# Patient Record
Sex: Female | Born: 1990 | Race: White | Hispanic: No | Marital: Single | Smoking: Light tobacco smoker
Health system: Southern US, Community
[De-identification: ages and names within clinical notes are randomized; demographics above are authoritative.]

## PROBLEM LIST (undated history)

## (undated) ENCOUNTER — Inpatient Hospital Stay (HOSPITAL_COMMUNITY): Payer: Self-pay

## (undated) DIAGNOSIS — N83209 Unspecified ovarian cyst, unspecified side: Secondary | ICD-10-CM

## (undated) DIAGNOSIS — D649 Anemia, unspecified: Secondary | ICD-10-CM

## (undated) HISTORY — PX: CHOLECYSTECTOMY: SHX55

---
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
## 2010-01-06 ENCOUNTER — Inpatient Hospital Stay (HOSPITAL_COMMUNITY): Admission: AD | Admit: 2010-01-06 | Discharge: 2010-01-06 | Payer: Self-pay | Admitting: Obstetrics & Gynecology

## 2010-01-07 ENCOUNTER — Inpatient Hospital Stay (HOSPITAL_COMMUNITY): Admission: AD | Admit: 2010-01-07 | Discharge: 2010-01-07 | Payer: Self-pay | Admitting: Obstetrics & Gynecology

## 2010-01-07 ENCOUNTER — Ambulatory Visit: Payer: Self-pay | Admitting: Obstetrics and Gynecology

## 2010-03-18 ENCOUNTER — Ambulatory Visit (HOSPITAL_COMMUNITY): Admission: RE | Admit: 2010-03-18 | Discharge: 2010-03-18 | Payer: Self-pay | Admitting: Obstetrics & Gynecology

## 2010-04-19 ENCOUNTER — Ambulatory Visit: Payer: Self-pay | Admitting: Family

## 2010-04-19 ENCOUNTER — Inpatient Hospital Stay (HOSPITAL_COMMUNITY): Admission: AD | Admit: 2010-04-19 | Discharge: 2010-04-20 | Payer: Self-pay | Admitting: Obstetrics and Gynecology

## 2010-05-26 ENCOUNTER — Inpatient Hospital Stay (HOSPITAL_COMMUNITY): Admission: AD | Admit: 2010-05-26 | Discharge: 2010-05-26 | Payer: Self-pay | Admitting: Obstetrics and Gynecology

## 2010-07-03 ENCOUNTER — Inpatient Hospital Stay (HOSPITAL_COMMUNITY): Admission: AD | Admit: 2010-07-03 | Discharge: 2010-07-03 | Payer: Self-pay | Admitting: Obstetrics

## 2010-07-20 ENCOUNTER — Inpatient Hospital Stay (HOSPITAL_COMMUNITY)
Admission: AD | Admit: 2010-07-20 | Discharge: 2010-07-20 | Payer: Self-pay | Source: Home / Self Care | Admitting: Obstetrics

## 2010-07-30 ENCOUNTER — Inpatient Hospital Stay (HOSPITAL_COMMUNITY): Admission: AD | Admit: 2010-07-30 | Discharge: 2010-07-30 | Payer: Self-pay | Admitting: Obstetrics

## 2010-08-01 ENCOUNTER — Inpatient Hospital Stay (HOSPITAL_COMMUNITY)
Admission: AD | Admit: 2010-08-01 | Discharge: 2010-08-01 | Payer: Self-pay | Source: Home / Self Care | Admitting: Obstetrics

## 2010-08-03 ENCOUNTER — Inpatient Hospital Stay (HOSPITAL_COMMUNITY)
Admission: AD | Admit: 2010-08-03 | Discharge: 2010-08-03 | Payer: Self-pay | Source: Home / Self Care | Admitting: Obstetrics & Gynecology

## 2010-08-10 ENCOUNTER — Inpatient Hospital Stay (HOSPITAL_COMMUNITY)
Admission: AD | Admit: 2010-08-10 | Discharge: 2010-08-13 | Payer: Self-pay | Source: Home / Self Care | Attending: Obstetrics | Admitting: Obstetrics

## 2010-09-20 ENCOUNTER — Encounter: Payer: Self-pay | Admitting: Pediatrics

## 2010-11-10 LAB — COMPREHENSIVE METABOLIC PANEL
ALT: 10 U/L (ref 0–35)
Chloride: 111 mEq/L (ref 96–112)
Creatinine, Ser: 0.58 mg/dL (ref 0.4–1.2)
GFR calc non Af Amer: >60 mL/min (ref >60)
Potassium: 3.7 mEq/L (ref 3.5–5.1)
Sodium: 138 mEq/L (ref 135–145)
Total Bilirubin: 0.4 mg/dL (ref 0.3–1.2)

## 2010-11-10 LAB — CBC
HCT: 29.5 % — ABNORMAL LOW (ref 36.0–46.0)
Hemoglobin: 8.9 g/dL — ABNORMAL LOW (ref 12.0–15.0)
Hemoglobin: 9.8 g/dL — ABNORMAL LOW (ref 12.0–15.0)
MCH: 29.3 pg (ref 26.0–34.0)
MCH: 29.5 pg (ref 26.0–34.0)
MCHC: 33.3 g/dL (ref 30.0–36.0)
MCHC: 33.6 g/dL (ref 30.0–36.0)
Platelets: 221 10*3/uL (ref 150–400)
RBC: 3.01 MIL/uL — ABNORMAL LOW (ref 3.87–5.11)
RBC: 3.6 MIL/uL — ABNORMAL LOW (ref 3.87–5.11)
RBC: 3.31 MIL/uL — ABNORMAL LOW (ref 3.87–5.11)
RDW: 15.7 % — ABNORMAL HIGH (ref 11.5–15.5)
RDW: 13.5 % (ref 11.5–15.5)
WBC: 10 10*3/uL (ref 4.0–10.5)
WBC: 11.7 10*3/uL — ABNORMAL HIGH (ref 4.0–10.5)
WBC: 9.8 10*3/uL (ref 4.0–10.5)

## 2010-11-10 LAB — URINALYSIS, ROUTINE W REFLEX MICROSCOPIC
Bilirubin Urine: NEGATIVE
Bilirubin Urine: NEGATIVE
Nitrite: NEGATIVE
Nitrite: NEGATIVE
Protein, ur: NEGATIVE mg/dL
Specific Gravity, Urine: 1.02 (ref 1.005–1.030)
Urobilinogen, UA: 0.2 mg/dL (ref 0.0–1.0)
pH: 5.5 (ref 5.0–8.0)
pH: 6 (ref 5.0–8.0)

## 2010-11-12 LAB — URINALYSIS, ROUTINE W REFLEX MICROSCOPIC
Bilirubin Urine: NEGATIVE
Hgb urine dipstick: NEGATIVE
Specific Gravity, Urine: 1.015 (ref 1.005–1.030)

## 2010-11-12 LAB — FETAL FIBRONECTIN: Fetal Fibronectin: NEGATIVE

## 2010-11-17 LAB — URINE MICROSCOPIC-ADD ON

## 2010-11-17 LAB — URINALYSIS, ROUTINE W REFLEX MICROSCOPIC
Glucose, UA: NEGATIVE mg/dL
Glucose, UA: NEGATIVE mg/dL
Ketones, ur: >80 mg/dL — AB
Protein, ur: 30 mg/dL — AB
Specific Gravity, Urine: 1.005 (ref 1.005–1.030)
pH: 5.5 (ref 5.0–8.0)

## 2010-11-17 LAB — DIFFERENTIAL
Eosinophils Relative: 0 % (ref 0–5)
Lymphocytes Relative: 12 % (ref 12–46)
Lymphs Abs: 2.2 10*3/uL (ref 0.7–4.0)
Neutro Abs: 15.1 10*3/uL — ABNORMAL HIGH (ref 1.7–7.7)

## 2010-11-17 LAB — BASIC METABOLIC PANEL
BUN: 8 mg/dL (ref 6–23)
Calcium: 8.7 mg/dL (ref 8.4–10.5)
GFR calc non Af Amer: >60 mL/min (ref >60)
Potassium: 3.5 mEq/L (ref 3.5–5.1)
Sodium: 130 mEq/L — ABNORMAL LOW (ref 135–145)

## 2010-11-17 LAB — CBC
HCT: 36.3 % (ref 36.0–46.0)
Hemoglobin: 12.6 g/dL (ref 12.0–15.0)
Hemoglobin: 13 g/dL (ref 12.0–15.0)
MCV: 92.9 fL (ref 78.0–100.0)
Platelets: 291 10*3/uL (ref 150–400)
Platelets: 291 10*3/uL (ref 150–400)
RBC: 4.07 MIL/uL (ref 3.87–5.11)
WBC: 18.6 10*3/uL — ABNORMAL HIGH (ref 4.0–10.5)

## 2011-01-12 NOTE — Discharge Summary (Signed)
NAMELAGENA, Karla Stafford                 ACCOUNT NO.:  [id number]   MEDICAL RECORD NO.:  [id number]          PATIENT TYPE:  INP   LOCATION:  6123                         FACILITY:  [facility]   PHYSICIAN:  Karla Hoover, MD    DATE OF BIRTH:  September 18, 1990   DATE OF ADMISSION:  04/24/2007  DATE OF DISCHARGE:  04/27/2007                               DISCHARGE SUMMARY   REASON FOR HOSPITALIZATION:  Nuchal rigidity, headache, and fever.   SIGNIFICANT FINDINGS ON ADMISSION:  Photophobia, resolved.  Emesis and  myalgias in left lower flank.  Temperature 99.2, heart rate 107.  Mild  nuchal rigidity limited to 45-degree flexion.  No Kernig or Brudzinski.  Alert and oriented.  Pustule on posterior thigh.  White blood count of  15.7, platelets 310,000.  Sodium 133, potassium 3.3.  Urinalysis within  normal limits.  Mono negative.  Rapid strep negative.  CSF with 2 white  blood cells (19% neutrophils, 86% lymphocytes), 1 red blood cell,  glucose 60, protein 22.  Gram stain showed white blood cells with no  organisms.  Negative influenza test.   TREATMENT:  Rocephin x2 at urgent care.  Started on doxycycline at  urgent care and continued here in the hospital.  IV fluids, Tylenol,  morphine, Toradol, oxycodone, and OxyContin.  Also, Valtrex per home  regimen and MiraLax.   OPERATIONS AND PROCEDURE:  Lumbar puncture in the emergency room,  fluoroscopy guided.   FINAL DIAGNOSIS:  Possible [place] Spotted Fever.   DISCHARGE MEDICATIONS AND INSTRUCTIONS:  1. Doxycycline 100 mg p.o. x7 days to complete a 10-day course.  2. Oxycodone 5 mg p.o. q.4 to 6 hours p.r.n. pain.  We have dispensed      18.  3. MiraLax 17 grams in water p.o. daily.  4. Valtrex as prescribed by primary care physician.  No prescription      given.   PENDING RESULTS AND ISSUES TO BE FOLLOWED:  Neck and shoulder myalgias  as well as an HIV DNA PCR.   FOLLOWUP:  Scheduled with Burnett Kanaris on May 02, 2007 at  10:40 a.m.   DISCHARGE WEIGHT:  80 kg.   DISCHARGE CONDITION:  Good.      Romero Belling, MD  Electronically Signed      Karla Hoover, MD  Electronically Signed    MO/MEDQ  D:  04/27/2007  T:  04/27/2007  Job:  [id number]   cc:   Burnett Kanaris [phone]

## 2011-01-12 NOTE — Op Note (Signed)
Karla Stafford, Karla Stafford                 ACCOUNT NO.:  [id number]   MEDICAL RECORD NO.:  [id number]          PATIENT TYPE:  AMB   LOCATION:  ENDO                         FACILITY:  [facility]   PHYSICIAN:  Anselmo Rod, M.D.  DATE OF BIRTH:  22-Jan-1991   DATE OF PROCEDURE:  10/20/2007  DATE OF DISCHARGE:                               OPERATIVE REPORT   PROCEDURE PERFORMED:  Esophagogastroduodenoscopy with antral biopsies.   ENDOSCOPIST:  Anselmo Rod, M.D.   INSTRUMENT USED:  Pentax video panendoscope.   INDICATIONS FOR PROCEDURE:  19 year old white female with a history of  abnormal weight gain of 40 pounds in the last year, severe nausea and  vomiting, biliary dyskinesia noted on recent HIDA scan where she had an  EF of 30%, undergoing EGD for nausea, vomiting and severe reflux.  Surgical evaluation is planned but peptic ulcer disease, etc., needs to  be ruled out prior to the evaluation. Therefore, EGD is being done.   PREPROCEDURE PREPARATION:  Informed consent was procured from the  patient.  The patient fasted for 8 hours prior to the procedure.  The  risks and benefits of the procedure were discussed with the patient in  detail.   PREPROCEDURE PHYSICAL:  The patient had stable vital signs.  Neck  supple.  Chest clear to auscultation.  S1 and S2 regular.  Abdomen soft  with normal bowel sounds.   DESCRIPTION OF PROCEDURE:  The patient was placed in the left lateral  decubitus position and sedated with 75 mcg of Fentanyl and 7 mg of  Versed given intravenously in slow incremental doses.  Once the patient  was adequately sedated and maintained on low flow oxygen and continuous  cardiac monitoring, the Pentax video panendoscope was advanced through  the mouthpiece over the tongue into the esophagus under direct vision.  The vocal cords appeared healthy.  The entire esophagus was widely  patent with no evidence of ring, stricture, mass, esophagitis or  Barrett's mucosa.  The  scope was then advanced in the stomach.  The GE  junction appeared healthy.  There was some debris along the greater  curvature.  This was very small in amount and the rest of the gastric  mucosa showed evidence of diffuse gastritis.  A small hiatal hernia was  seen on high retroflexion.  Antral biopsies were done to rule out  presence of H. pylori by pathology.  The proximal small bowel appeared  normal with no outlet obstruction.  The patient tolerated the procedure  well without immediate complications.  No ulcers, erosions, masses or  polyps were seen throughout the upper GI tract.   IMPRESSION:  1. Normal appearing vocal cords.  2. Healthy appearing esophagus.  3. Normal appearing GE junction.  4. Debris in the stomach along the right greater curvature.  5. Diffuse gastritis.  Antral biopsies done to rule out H. pylori.  6. Normal proximal small bowel.   RECOMMENDATIONS:  1. A gastric emptying study will be done to rule out gastroparesis      prior to surgical evaluation.  2. The patient is  to avoid all nonsteroidals for now.  3. Continue Prevacid for reflux.  4  Treat with antibiotics if H. pylori present on biopsies.  1. Outpatient follow up in the next two weeks, earlier if need be.      Anselmo Rod, M.D.  Electronically Signed     JNM/MEDQ  D:  10/20/2007  T:  10/21/2007  Job:  [id number]   cc:   Candyce Churn. Allyne Gee, M.D.  Fax: [phone]

## 2011-05-25 ENCOUNTER — Emergency Department (HOSPITAL_COMMUNITY)
Admission: EM | Admit: 2011-05-25 | Discharge: 2011-05-26 | Disposition: A | Discharge status: Home / Self Care | Attending: Emergency Medicine | Admitting: Emergency Medicine

## 2011-05-25 DIAGNOSIS — R509 Fever, unspecified: Secondary | ICD-10-CM | POA: Insufficient documentation

## 2011-05-25 DIAGNOSIS — R110 Nausea: Secondary | ICD-10-CM | POA: Insufficient documentation

## 2011-05-25 DIAGNOSIS — R51 Headache: Secondary | ICD-10-CM | POA: Insufficient documentation

## 2011-05-25 DIAGNOSIS — M2569 Stiffness of other specified joint, not elsewhere classified: Secondary | ICD-10-CM | POA: Insufficient documentation

## 2011-05-25 DIAGNOSIS — H5710 Ocular pain, unspecified eye: Secondary | ICD-10-CM | POA: Insufficient documentation

## 2011-05-25 DIAGNOSIS — H53149 Visual discomfort, unspecified: Secondary | ICD-10-CM | POA: Insufficient documentation

## 2011-05-25 DIAGNOSIS — M549 Dorsalgia, unspecified: Secondary | ICD-10-CM | POA: Insufficient documentation

## 2011-05-25 DIAGNOSIS — A779 Spotted fever, unspecified: Secondary | ICD-10-CM | POA: Insufficient documentation

## 2011-05-25 DIAGNOSIS — IMO0001 Reserved for inherently not codable concepts without codable children: Secondary | ICD-10-CM | POA: Insufficient documentation

## 2011-05-25 DIAGNOSIS — R55 Syncope and collapse: Secondary | ICD-10-CM | POA: Insufficient documentation

## 2011-05-25 DIAGNOSIS — M542 Cervicalgia: Secondary | ICD-10-CM | POA: Insufficient documentation

## 2011-05-25 LAB — POCT I-STAT, CHEM 8
BUN: 9 mg/dL (ref 6–23)
Chloride: 106 mEq/L (ref 96–112)
Creatinine, Ser: 1 mg/dL (ref 0.50–1.10)
Sodium: 142 mEq/L (ref 135–145)
TCO2: 23 mmol/L (ref 0–100)

## 2011-05-25 LAB — CBC
HCT: 42 % (ref 36.0–46.0)
Hemoglobin: 14.3 g/dL (ref 12.0–15.0)
MCH: 29.1 pg (ref 26.0–34.0)
MCHC: 34 g/dL (ref 30.0–36.0)
MCV: 85.5 fL (ref 78.0–100.0)
RBC: 4.91 MIL/uL (ref 3.87–5.11)

## 2011-05-25 LAB — DIFFERENTIAL
Basophils Relative: 0 % (ref 0–1)
Lymphocytes Relative: 40 % (ref 12–46)
Lymphs Abs: 3 10*3/uL (ref 0.7–4.0)
Monocytes Absolute: 0.6 10*3/uL (ref 0.1–1.0)
Monocytes Relative: 8 % (ref 3–12)
Neutro Abs: 3.7 10*3/uL (ref 1.7–7.7)

## 2011-06-10 LAB — DIFFERENTIAL
Basophils Absolute: 0
Basophils Relative: 0
Eosinophils Absolute: 0
Eosinophils Relative: 0
Lymphocytes Relative: 11 — ABNORMAL LOW
Lymphs Abs: 2.3
Monocytes Absolute: 1.4 — ABNORMAL HIGH
Monocytes Relative: 7
Neutro Abs: 16.3 — ABNORMAL HIGH
Neutrophils Relative %: 82 — ABNORMAL HIGH

## 2011-06-10 LAB — CBC
HCT: 31.5 — ABNORMAL LOW
Hemoglobin: 10.8 — ABNORMAL LOW
MCHC: 34.2
MCV: 86.6
Platelets: 362 — ABNORMAL HIGH
RBC: 3.64 — ABNORMAL LOW
RDW: 13.2
WBC: 20 — ABNORMAL HIGH

## 2011-06-10 LAB — BASIC METABOLIC PANEL
BUN: 12
CO2: 21
Chloride: 106
Creatinine, Ser: 0.87
Glucose, Bld: 124 — ABNORMAL HIGH
Potassium: 3.4 — ABNORMAL LOW

## 2011-06-10 LAB — BASIC METABOLIC PANEL WITH GFR
Calcium: 8.3 — ABNORMAL LOW
Sodium: 136

## 2011-06-11 LAB — COMPREHENSIVE METABOLIC PANEL
ALT: 13
AST: 11
Albumin: 2.7 — ABNORMAL LOW
Alkaline Phosphatase: 75
BUN: 9
CO2: 26
Calcium: 8.8
Chloride: 110
Creatinine, Ser: 0.75
Glucose, Bld: 95
Potassium: 3.5
Sodium: 143
Total Bilirubin: 0.4
Total Protein: 5.8 — ABNORMAL LOW

## 2011-06-11 LAB — PREGNANCY, URINE: Preg Test, Ur: NEGATIVE

## 2011-06-11 LAB — PROTEIN, CSF: Total  Protein, CSF: 22

## 2011-06-11 LAB — CBC
HCT: 33.3 — ABNORMAL LOW
HCT: 37.3
Hemoglobin: 11.4 — ABNORMAL LOW
Hemoglobin: 12.8
MCHC: 34.1
MCHC: 34.4
MCV: 87.9
MCV: 88.7
Platelets: 310
Platelets: 317
RBC: 3.76 — ABNORMAL LOW
RDW: 12.8
RDW: 13.4
WBC: 10.4 — ABNORMAL HIGH

## 2011-06-11 LAB — CSF CELL COUNT WITH DIFFERENTIAL
Eosinophils, CSF: 0
Eosinophils, CSF: 0
Other Cells, CSF: 0
Other Cells, CSF: 1
Segmented Neutrophils-CSF: 1
Segmented Neutrophils-CSF: 2
Tube #: 1
Tube #: 4

## 2011-06-11 LAB — DIFFERENTIAL
Basophils Absolute: 0
Basophils Relative: 0
Eosinophils Absolute: 0
Eosinophils Relative: 0
Monocytes Absolute: 1.7 — ABNORMAL HIGH

## 2011-06-11 LAB — GRAM STAIN

## 2011-06-11 LAB — URINALYSIS, ROUTINE W REFLEX MICROSCOPIC
Glucose, UA: NEGATIVE
Hgb urine dipstick: NEGATIVE
Ketones, ur: NEGATIVE
Protein, ur: NEGATIVE
Urobilinogen, UA: 0.2

## 2011-06-11 LAB — ENTEROVIRUS PCR: Enterovirus PCR: NEGATIVE

## 2011-06-11 LAB — BASIC METABOLIC PANEL
BUN: 11
CO2: 22
Glucose, Bld: 129 — ABNORMAL HIGH
Potassium: 3.3 — ABNORMAL LOW
Sodium: 133 — ABNORMAL LOW

## 2011-06-11 LAB — CSF CULTURE W GRAM STAIN

## 2011-06-11 LAB — INFLUENZA A+B VIRUS AG-DIRECT(RAPID)

## 2011-06-11 LAB — MONONUCLEOSIS SCREEN: Mono Screen: NEGATIVE

## 2011-06-11 LAB — CULTURE, BLOOD (ROUTINE X 2)
Culture: NO GROWTH
Culture: NO GROWTH

## 2011-06-11 LAB — HIV-1 RNA, QUALITATIVE, TMA: HIV-1 RNA, Qualitative, TMA: NOT DETECTED

## 2011-06-11 LAB — GLUCOSE, CSF: Glucose, CSF: 60

## 2011-06-11 LAB — RPR: RPR Ser Ql: NONREACTIVE

## 2011-07-01 ENCOUNTER — Inpatient Hospital Stay (INDEPENDENT_AMBULATORY_CARE_PROVIDER_SITE_OTHER)
Admission: RE | Admit: 2011-07-01 | Discharge: 2011-07-01 | Disposition: A | Discharge status: Home / Self Care | Payer: Self-pay | Source: Ambulatory Visit | Attending: Family Medicine | Admitting: Family Medicine

## 2011-07-01 DIAGNOSIS — J40 Bronchitis, not specified as acute or chronic: Secondary | ICD-10-CM

## 2012-03-22 ENCOUNTER — Encounter (HOSPITAL_COMMUNITY): Payer: Self-pay

## 2012-03-22 ENCOUNTER — Encounter (HOSPITAL_COMMUNITY): Payer: Self-pay | Admitting: *Deleted

## 2012-03-22 ENCOUNTER — Emergency Department (HOSPITAL_COMMUNITY)
Admission: EM | Admit: 2012-03-22 | Discharge: 2012-03-22 | Disposition: A | Discharge status: Home / Self Care | Attending: Emergency Medicine | Admitting: Emergency Medicine

## 2012-03-22 ENCOUNTER — Emergency Department (INDEPENDENT_AMBULATORY_CARE_PROVIDER_SITE_OTHER)
Admission: EM | Admit: 2012-03-22 | Discharge: 2012-03-22 | Disposition: A | Discharge status: Nursing Home (Includes ICF) | Source: Home / Self Care | Attending: Emergency Medicine | Admitting: Emergency Medicine

## 2012-03-22 ENCOUNTER — Emergency Department (HOSPITAL_COMMUNITY)

## 2012-03-22 DIAGNOSIS — N760 Acute vaginitis: Secondary | ICD-10-CM

## 2012-03-22 DIAGNOSIS — R5383 Other fatigue: Secondary | ICD-10-CM | POA: Insufficient documentation

## 2012-03-22 DIAGNOSIS — A499 Bacterial infection, unspecified: Secondary | ICD-10-CM | POA: Insufficient documentation

## 2012-03-22 DIAGNOSIS — R102 Pelvic and perineal pain: Secondary | ICD-10-CM

## 2012-03-22 DIAGNOSIS — N949 Unspecified condition associated with female genital organs and menstrual cycle: Secondary | ICD-10-CM

## 2012-03-22 DIAGNOSIS — R109 Unspecified abdominal pain: Secondary | ICD-10-CM | POA: Insufficient documentation

## 2012-03-22 DIAGNOSIS — B9689 Other specified bacterial agents as the cause of diseases classified elsewhere: Secondary | ICD-10-CM | POA: Insufficient documentation

## 2012-03-22 DIAGNOSIS — R10819 Abdominal tenderness, unspecified site: Secondary | ICD-10-CM | POA: Insufficient documentation

## 2012-03-22 DIAGNOSIS — R5381 Other malaise: Secondary | ICD-10-CM | POA: Insufficient documentation

## 2012-03-22 LAB — POCT I-STAT, CHEM 8
Calcium, Ion: 1.28 mmol/L — ABNORMAL HIGH (ref 1.12–1.23)
Creatinine, Ser: 0.8 mg/dL (ref 0.50–1.10)
Glucose, Bld: 83 mg/dL (ref 70–99)
HCT: 45 % (ref 36.0–46.0)
Hemoglobin: 15.3 g/dL — ABNORMAL HIGH (ref 12.0–15.0)
Potassium: 4.2 mEq/L (ref 3.5–5.1)

## 2012-03-22 LAB — URINALYSIS, ROUTINE W REFLEX MICROSCOPIC
Bilirubin Urine: NEGATIVE
Hgb urine dipstick: NEGATIVE
Ketones, ur: NEGATIVE mg/dL
Nitrite: NEGATIVE
Specific Gravity, Urine: 1.024 (ref 1.005–1.030)
Urobilinogen, UA: 0.2 mg/dL (ref 0.0–1.0)
pH: 5 (ref 5.0–8.0)

## 2012-03-22 LAB — WET PREP, GENITAL
Trich, Wet Prep: NONE SEEN
Yeast Wet Prep HPF POC: NONE SEEN

## 2012-03-22 LAB — LIPASE, BLOOD: Lipase: 21 U/L (ref 11–59)

## 2012-03-22 LAB — CBC
HCT: 43.5 % (ref 36.0–46.0)
Hemoglobin: 14.5 g/dL (ref 12.0–15.0)
MCHC: 33.3 g/dL (ref 30.0–36.0)
MCV: 88.6 fL (ref 78.0–100.0)
RDW: 13.1 % (ref 11.5–15.5)

## 2012-03-22 LAB — POCT PREGNANCY, URINE: Preg Test, Ur: NEGATIVE

## 2012-03-22 LAB — POCT URINALYSIS DIP (DEVICE)
Bilirubin Urine: NEGATIVE
Glucose, UA: NEGATIVE mg/dL

## 2012-03-22 MED ORDER — OXYCODONE-ACETAMINOPHEN 5-325 MG PO TABS: 2.0000 | ORAL_TABLET | ORAL | Status: AC | PRN

## 2012-03-22 MED ORDER — MORPHINE SULFATE 4 MG/ML IJ SOLN
4.0000 mg | Freq: Once | INTRAMUSCULAR | Status: AC
  Administered 2012-03-22: 4 mg via INTRAVENOUS
  Filled 2012-03-22: qty 1

## 2012-03-22 MED ORDER — HYDROCODONE-ACETAMINOPHEN 5-325 MG PO TABS
2.0000 | ORAL_TABLET | Freq: Once | ORAL | Status: AC
  Administered 2012-03-22: 2 via ORAL
  Filled 2012-03-22: qty 2

## 2012-03-22 MED ORDER — METRONIDAZOLE 500 MG PO TABS
500.0000 mg | ORAL_TABLET | Freq: Once | ORAL | Status: AC
Start: 2012-03-22 — End: 2012-03-22
  Administered 2012-03-22: 500 mg via ORAL
  Filled 2012-03-22: qty 2

## 2012-03-22 MED ORDER — METRONIDAZOLE 500 MG PO TABS: 500.0000 mg | ORAL_TABLET | Freq: Two times a day (BID) | ORAL | Status: AC

## 2012-03-22 NOTE — ED Notes (Signed)
To ED from [facility] for further eval of right and left lower quad pain. States she had her son 19 months ago and has just had her first menses since giving birth. States her menses was 'long'. Denies urinary discomfort. Denies vaginal discharge.

## 2012-03-22 NOTE — ED Provider Notes (Addendum)
History     CSN: [id number]  Arrival date & time 03/22/12  1627   First MD Initiated Contact with Patient 03/22/12 1642      Chief Complaint  Patient presents with  . Abdominal Pain    (Consider location/radiation/quality/duration/timing/severity/associated sxs/prior treatment) HPI Comments: Patient presents to urgent care complaining that she has been having her period since July 13 interval yesterday. For the last 2 days have been having moderate to severe lower abdominal pain (patient points to pelvic area bilaterally). She denies any urinary symptoms such as pressure, burning on urination or fevers. Also denies  vomiting. She describes that the pain is constant and movement does exacerbate her pain. He has felt some nausea but has not vomited. She relates that since she gave birth she has not had a normal menstrual cycle her. Since then. This last period lasted several days more than usual and had more blood than what she has seen.  Patient is a 21 y.o. female presenting with abdominal pain. The history is provided by the patient.  Abdominal Pain The primary symptoms of the illness include abdominal pain, nausea and vaginal bleeding. The primary symptoms of the illness do not include fever, fatigue, vomiting, diarrhea, hematochezia, dysuria or vaginal discharge. The current episode started more than 2 days ago. The onset of the illness was gradual. The problem has been gradually worsening.  The patient states that she believes she is currently not pregnant. The patient has not had a change in bowel habit. Symptoms associated with the illness do not include chills, anorexia, constipation or back pain. Significant associated medical issues do not include GERD.    History reviewed. No pertinent past medical history.  Past Surgical History  Procedure Date  . Cholecystectomy     No family history on file.  History  Substance Use Topics  . Smoking status: Current Everyday Smoker  .  Smokeless tobacco: Not on file  . Alcohol Use: No    OB History    Grav Para Term Preterm Abortions TAB SAB Ect Mult Living                  Review of Systems  Constitutional: Positive for activity change and appetite change. Negative for fever, chills and fatigue.  Gastrointestinal: Positive for nausea and abdominal pain. Negative for vomiting, diarrhea, constipation, hematochezia, rectal pain and anorexia.  Genitourinary: Positive for vaginal bleeding. Negative for dysuria and vaginal discharge.  Musculoskeletal: Negative for back pain.  Neurological: Negative for dizziness.    Allergies  Clindamycin/lincomycin; Doxycycline; and Penicillins  Home Medications   Current Outpatient Rx  Name Route Sig Dispense Refill  . METRONIDAZOLE 500 MG PO TABS Oral Take 1 tablet (500 mg total) by mouth 2 (two) times daily. 14 tablet 0  . OXYCODONE-ACETAMINOPHEN 5-325 MG PO TABS Oral Take 2 tablets by mouth every 4 (four) hours as needed for pain. 15 tablet 0    BP 105/71  Pulse 72  Temp 97.5 F (36.4 C) (Oral)  Resp 18  SpO2 98%  LMP 03/07/2012  Physical Exam  Nursing note and vitals reviewed. Constitutional: She appears distressed.  Eyes: Conjunctivae are normal.  Neck: Neck supple.  Pulmonary/Chest: Effort normal.  Abdominal: She exhibits no distension. There is no hepatosplenomegaly. There is tenderness in the right lower quadrant, suprapubic area and left lower quadrant. There is no rigidity, no rebound, no guarding and no CVA tenderness.    Neurological: She is alert.  Skin: No erythema.    ED  Course  Procedures (including critical care time)   Labs Reviewed  POCT URINALYSIS DIP (DEVICE)  POCT PREGNANCY, URINE   Ct Abdomen Pelvis Wo Contrast  03/22/2012  *RADIOLOGY REPORT*  Clinical Data: Flank pain  CT ABDOMEN AND PELVIS WITHOUT CONTRAST  Technique:  Multidetector CT imaging of the abdomen and pelvis was performed following the standard protocol without intravenous  contrast.  Comparison: None.  Findings: Lung bases are clear.  Liver, spleen, pancreas, and adrenal glands are within normal limits.  Status post cholecystectomy.  No intrahepatic or extrahepatic ductal dilatation.  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  Kidneys are otherwise unremarkable.  No renal calculi or hydronephrosis.  No evidence of bowel obstruction.  Normal appendix.  No evidence of abdominal aortic aneurysm.  No abdominopelvic ascites.  Small upper abdominal/retroperitoneal lymph nodes which do not meet pathologic CT size criteria.  Uterus and bilateral ovaries are unremarkable.  No ureteral or bladder calculi.  Bladder is decompressed.  Visualized osseous structures are within normal limits.  IMPRESSION: No renal, ureteral, or bladder calculi.  No hydronephrosis.  Normal appendix.  No evidence of bowel obstruction.  No CT findings to account for the patient's flank pain.  Original Report Authenticated By: Charline Bills, M.D.     1. Pelvic pain in female       MDM  Moderate to severe reproducible lower abdominal pain bilaterally. Suspect given patient's recent menstrual irregularities that this might be an ovarian cyst or hemorrhagic ovarian cyst ruptured. Her exam was somewhat disproportionate as well to the degree of palpation but patient is crying describing severe pain with minimal palpation.        Jimmie Molly, MD 03/22/12 1809  Jimmie Molly, MD 03/23/12 1046

## 2012-03-22 NOTE — ED Notes (Signed)
Pt c/o increased pain to RLQ after returning from CT, pt tearful, orders received.

## 2012-03-22 NOTE — Progress Notes (Signed)
Medical screening examination/treatment/procedure(s) were conducted as a shared visit with non-physician practitioner(s) and myself.  I personally evaluated the patient during the encounter Pt is a 21 year old woman with RLQ pain.  Lab workup shows normal CBC, chemistries, urinalysis, CT of abdomen/pelvis.  Wet prep showed clue cells. Rx for Bacterial Vaginosis with metronidazole, pain medication.  Given cautions to return for vomiting, fever, increased abdominal pain.

## 2012-03-22 NOTE — ED Notes (Signed)
Pt c/o L lower abdominal pain onset 3 days ago.  Pt states she has felt nauseated, denies emesis.  Pt states pain radiates to lower back.  Pt denies urinary SX.  Pt states she gave birth in 12/12 and just had 1st menstrual cycle that was "different from how they used to be".

## 2012-03-22 NOTE — ED Provider Notes (Signed)
History     CSN: [id number]  Arrival date & time 03/22/12  9147   First MD Initiated Contact with Patient 03/22/12 2146      Chief Complaint  Patient presents with  . Abdominal Pain    (Consider location/radiation/quality/duration/timing/severity/associated sxs/prior treatment) HPI Comments: Patient is a 21 year old female who presents to the ED from urgent care. She has been experiencing lower abdominal pain for 3 days. The pain radiates from her lower abdomen into her upper thighs. She says the pain had gradually progressed over the past couple days. It started as a cramping sensation and now feels like a stabbing pain. She rated the pain 8/10 currently with associated nausea but no vomiting. Her last period started 03/06/2012 and have lasted until today. This is her first period since having her son in December 2011 and this period is different from her menses before giving birth as this period was heavier, lasted longer, and had many clots. Patient has tried ibuprofen for the pain which did not help.   Patient is a 20 y.o. female presenting with abdominal pain.  Abdominal Pain The primary symptoms of the illness include abdominal pain, fatigue and nausea. The primary symptoms of the illness do not include fever, shortness of breath, vomiting, diarrhea or dysuria.  Symptoms associated with the illness do not include diaphoresis, frequency or back pain.    History reviewed. No pertinent past medical history.  Past Surgical History  Procedure Date  . Cholecystectomy     History reviewed. No pertinent family history.  History  Substance Use Topics  . Smoking status: Current Everyday Smoker  . Smokeless tobacco: Not on file  . Alcohol Use: No    OB History    Grav Para Term Preterm Abortions TAB SAB Ect Mult Living                  Review of Systems  Constitutional: Positive for appetite change and fatigue. Negative for fever and diaphoresis.  Respiratory: Negative for  cough, shortness of breath and wheezing.   Cardiovascular: Negative for chest pain.  Gastrointestinal: Positive for nausea and abdominal pain. Negative for vomiting and diarrhea.  Genitourinary: Negative for dysuria, frequency and difficulty urinating.  Musculoskeletal: Negative for back pain.  Neurological: Negative for headaches.    Allergies  Clindamycin/lincomycin; Doxycycline; and Penicillins  Home Medications  No current outpatient prescriptions on file.  BP 91/66  Pulse 89  Temp 98.7 F (37.1 C) (Oral)  Resp 18  SpO2 98%  LMP 03/07/2012  Physical Exam  Nursing note and vitals reviewed. Constitutional: She appears well-developed and well-nourished.  HENT:  Head: Normocephalic and atraumatic.  Mouth/Throat: Oropharynx is clear and moist. No oropharyngeal exudate.  Eyes: Conjunctivae are normal. No scleral icterus.  Neck: Normal range of motion.  Cardiovascular: Normal rate and regular rhythm.  Exam reveals no gallop and no friction rub.   No murmur heard. Pulmonary/Chest: Effort normal and breath sounds normal. She has no wheezes. She has no rales. She exhibits no tenderness.  Abdominal: Soft. There is tenderness. There is rebound. There is no guarding.       Patient expressed pain and tenderness at McBurney's point, especially with palpation. Generalized lower abdominal pain to palpation.   Genitourinary: No vaginal discharge found.       Vaginal mucosa pink and moist with moderate amount of white milky discharge. Patient expressed discomfort on speculum exam. Patient expressed generalized tenderness and pain to palpation of lower abdomen which was most  notable with palpation of RLQ. No masses palpated.   Musculoskeletal: Normal range of motion.  Neurological: She is alert.  Skin: Skin is warm and dry. She is not diaphoretic.  Psychiatric: She has a normal mood and affect. Her behavior is normal.    ED Course  Procedures (including critical care time)  Labs  Reviewed  CBC - Abnormal; Notable for the following:    Platelets 415 (*)     All other components within normal limits  POCT I-STAT, CHEM 8 - Abnormal; Notable for the following:    Calcium, Ion 1.28 (*)     Hemoglobin 15.3 (*)     All other components within normal limits  WET PREP, GENITAL - Abnormal; Notable for the following:    Clue Cells Wet Prep HPF POC MODERATE (*)     WBC, Wet Prep HPF POC FEW (*)     All other components within normal limits  URINALYSIS, ROUTINE W REFLEX MICROSCOPIC  POCT PREGNANCY, URINE  LIPASE, BLOOD  GC/CHLAMYDIA PROBE AMP, GENITAL   Ct Abdomen Pelvis Wo Contrast  03/22/2012  *RADIOLOGY REPORT*  Clinical Data: Flank pain  CT ABDOMEN AND PELVIS WITHOUT CONTRAST  Technique:  Multidetector CT imaging of the abdomen and pelvis was performed following the standard protocol without intravenous contrast.  Comparison: None.  Findings: Lung bases are clear.  Liver, spleen, pancreas, and adrenal glands are within normal limits.  Status post cholecystectomy.  No intrahepatic or extrahepatic ductal dilatation.  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  Kidneys are otherwise unremarkable.  No renal calculi or hydronephrosis.  No evidence of bowel obstruction.  Normal appendix.  No evidence of abdominal aortic aneurysm.  No abdominopelvic ascites.  Small upper abdominal/retroperitoneal lymph nodes which do not meet pathologic CT size criteria.  Uterus and bilateral ovaries are unremarkable.  No ureteral or bladder calculi.  Bladder is decompressed.  Visualized osseous structures are within normal limits.  IMPRESSION: No renal, ureteral, or bladder calculi.  No hydronephrosis.  Normal appendix.  No evidence of bowel obstruction.  No CT findings to account for the patient's flank pain.  Original Report Authenticated By: Charline Bills, M.D.     No diagnosis found.    MDM  10:07 PM Pelvic exam performed on patient which showed moderate  white discharge in vagina. Bimanual exam revealed generalized lower abdominal pain which was most prominent in the RLQ. CT of abdomen ordered.   11:04 PM Wet prep shows clue cells. Patient will be treated for BV. CT abdomen pending. Patient expresses pain in RLQ and will receive morphine.   11:27 PM Abdomen CT shows no evidence of bowel obstruction or appendicitis.   Patient shows no evidence of acute abdominal process based on her ED evaluation. Patient will be prescribed Metronidazole for BV. She is still experiencing pain of unknown etiology but there is no evidence of emergent process causing pain. She will be discharged with antibiotics and Percocet for pain. She is advised to return to the ED if symptoms worsen.     Emilia Beck, PA-C 03/23/12 [phone]

## 2012-03-23 LAB — GC/CHLAMYDIA PROBE AMP, GENITAL: GC Probe Amp, Genital: NEGATIVE

## 2012-03-23 NOTE — ED Provider Notes (Signed)
Medical screening examination/treatment/procedure(s) were conducted as a shared visit with non-physician practitioner(s) and myself.  I personally evaluated the patient during the encounter Medical screening examination/treatment/procedure(s) were conducted as a shared visit with non-physician practitioner(s) and myself. I personally evaluated the patient during the encounter  Pt is a 21 year old woman with RLQ pain. Lab workup shows normal CBC, chemistries, urinalysis, CT of abdomen/pelvis. Wet prep showed clue cells. Rx for Bacterial Vaginosis with metronidazole, pain medication. Given cautions to return for vomiting, fever, increased abdominal pain.       Carleene Cooper III, MD 03/23/12 [phone]

## 2013-03-25 ENCOUNTER — Encounter (HOSPITAL_COMMUNITY): Payer: Self-pay | Admitting: Nurse Practitioner

## 2013-03-25 ENCOUNTER — Emergency Department (HOSPITAL_COMMUNITY)
Admission: EM | Admit: 2013-03-25 | Discharge: 2013-03-25 | Disposition: A | Discharge status: Home / Self Care | Attending: Emergency Medicine | Admitting: Emergency Medicine

## 2013-03-25 ENCOUNTER — Emergency Department (HOSPITAL_COMMUNITY)

## 2013-03-25 DIAGNOSIS — R109 Unspecified abdominal pain: Secondary | ICD-10-CM | POA: Insufficient documentation

## 2013-03-25 DIAGNOSIS — Z3202 Encounter for pregnancy test, result negative: Secondary | ICD-10-CM | POA: Insufficient documentation

## 2013-03-25 DIAGNOSIS — N949 Unspecified condition associated with female genital organs and menstrual cycle: Secondary | ICD-10-CM | POA: Insufficient documentation

## 2013-03-25 DIAGNOSIS — R102 Pelvic and perineal pain: Secondary | ICD-10-CM

## 2013-03-25 DIAGNOSIS — Z87448 Personal history of other diseases of urinary system: Secondary | ICD-10-CM | POA: Insufficient documentation

## 2013-03-25 DIAGNOSIS — Z87440 Personal history of urinary (tract) infections: Secondary | ICD-10-CM | POA: Insufficient documentation

## 2013-03-25 DIAGNOSIS — Z880 Allergy status to penicillin: Secondary | ICD-10-CM | POA: Insufficient documentation

## 2013-03-25 DIAGNOSIS — F17200 Nicotine dependence, unspecified, uncomplicated: Secondary | ICD-10-CM | POA: Insufficient documentation

## 2013-03-25 DIAGNOSIS — Z79899 Other long term (current) drug therapy: Secondary | ICD-10-CM | POA: Insufficient documentation

## 2013-03-25 DIAGNOSIS — N938 Other specified abnormal uterine and vaginal bleeding: Secondary | ICD-10-CM | POA: Insufficient documentation

## 2013-03-25 LAB — CBC
HCT: 40.2 % (ref 36.0–46.0)
Hemoglobin: 13.5 g/dL (ref 12.0–15.0)
RBC: 4.6 MIL/uL (ref 3.87–5.11)
RDW: 13.2 % (ref 11.5–15.5)
WBC: 9.7 10*3/uL (ref 4.0–10.5)

## 2013-03-25 LAB — BASIC METABOLIC PANEL
BUN: 13 mg/dL (ref 6–23)
Chloride: 105 mEq/L (ref 96–112)
GFR calc Af Amer: >90 mL/min (ref >90)
Glucose, Bld: 104 mg/dL — ABNORMAL HIGH (ref 70–99)
Potassium: 3.7 mEq/L (ref 3.5–5.1)

## 2013-03-25 LAB — URINALYSIS, ROUTINE W REFLEX MICROSCOPIC
Bilirubin Urine: NEGATIVE
Nitrite: NEGATIVE
Specific Gravity, Urine: 1.033 — ABNORMAL HIGH (ref 1.005–1.030)
pH: 5 (ref 5.0–8.0)

## 2013-03-25 LAB — PROTIME-INR
INR: 1.12 (ref 0.00–1.49)
Prothrombin Time: 14.2 seconds (ref 11.6–15.2)

## 2013-03-25 MED ORDER — ONDANSETRON HCL 4 MG/2ML IJ SOLN
4.0000 mg | Freq: Once | INTRAMUSCULAR | Status: AC
  Administered 2013-03-25: 4 mg via INTRAVENOUS
  Filled 2013-03-25: qty 2

## 2013-03-25 MED ORDER — SODIUM CHLORIDE 0.9 % IV BOLUS (SEPSIS)
1000.0000 mL | Freq: Once | INTRAVENOUS | Status: AC
  Administered 2013-03-25: 1000 mL via INTRAVENOUS

## 2013-03-25 MED ORDER — ONDANSETRON 8 MG PO TBDP: ORAL_TABLET | ORAL | Status: DC

## 2013-03-25 MED ORDER — ESTROGENS CONJUGATED 1.25 MG PO TABS: 2.5000 mg | ORAL_TABLET | Freq: Two times a day (BID) | ORAL | Status: DC

## 2013-03-25 MED ORDER — MORPHINE SULFATE 4 MG/ML IJ SOLN
4.0000 mg | Freq: Once | INTRAMUSCULAR | Status: AC
  Administered 2013-03-25: 4 mg via INTRAVENOUS
  Filled 2013-03-25: qty 1

## 2013-03-25 MED ORDER — HYDROCODONE-ACETAMINOPHEN 5-325 MG PO TABS
1.0000 | ORAL_TABLET | ORAL | Status: DC | PRN
Start: 2013-03-25 — End: 2014-12-01

## 2013-03-25 NOTE — ED Notes (Signed)
US pelvis not completed.

## 2013-03-25 NOTE — ED Notes (Signed)
Patient transported to Ultrasound 

## 2013-03-25 NOTE — ED Notes (Addendum)
Pt using Nuva Ring and has been regular with periods prior to now.  Pt inserted this last Nuva Ring two weeks ago today and it worked well for one week; then pt began having very large clots along with bright red blood the size of her thumb per pt.  She is having some lower abdominal pain on the right.  Pt has had two pregnancies.

## 2013-03-25 NOTE — ED Provider Notes (Signed)
CSN: [id number]     Arrival date & time 03/25/13  1553 History     First MD Initiated Contact with Patient 03/25/13 1605     Chief Complaint  Patient presents with  . Vaginal Bleeding   (Consider location/radiation/quality/duration/timing/severity/associated sxs/prior Treatment) Patient is a 22 y.o. female presenting with vaginal bleeding. The history is provided by the patient and medical records. No language interpreter was used.  Vaginal Bleeding Associated symptoms: abdominal pain   Associated symptoms: no back pain, no dysuria, no fatigue, no fever, no nausea and no vaginal discharge     Karla Stafford is a 22 y.o. female  G1P1 with a hx of induced vaginal delivery presents to the Emergency Department complaining of gradual, persistent, progressively worsening vaginal bleeding in the right lower pelvic pain beginning one week ago.  Patient states history of Nuvaring use for contraception.  Patient states she removed the NuvaRing on 03/04/2013, blood for 4 days as usual and reinserted the rain on 03/11/2013.   Patient one week without symptoms and then began with vaginal bleeding on 03/18/2013.  She removed the NuvaRing 3 days later with persistent vaginal bleeding. She denies sexual trauma. Pt is sexually active with one sexual partner no history of STDs. Associated symptoms include right lower pelvic pain beginning at the same time if the vaginal bleeding as well as a large number of clots.  Nothing makes it better and nothing makes it worse.  Pt denies fever, chills, headache, neck pain, chest pain, shortness of breath, nausea, vomiting, diarrhea, weakness, dizziness, syncope, dysuria, vaginal discharge, dyspareunia.  Patient reports recent history of urinary tract infection with pyelonephritis and sepsis for which she was hospitalized and treated. She states 3 weeks ago her urine was clean reevaluation. Patient does not have a current OB/GYN.   History reviewed. No pertinent past medical  history. Past Surgical History  Procedure Laterality Date  . Cholecystectomy     History reviewed. No pertinent family history. History  Substance Use Topics  . Smoking status: Current Every Day Smoker  . Smokeless tobacco: Not on file  . Alcohol Use: No   OB History   Grav Para Term Preterm Abortions TAB SAB Ect Mult Living            1     Review of Systems  Constitutional: Negative for fever, diaphoresis, appetite change, fatigue and unexpected weight change.  HENT: Negative for mouth sores, trouble swallowing, neck pain and neck stiffness.   Respiratory: Negative for cough, chest tightness, shortness of breath, wheezing and stridor.   Cardiovascular: Negative for chest pain and palpitations.  Gastrointestinal: Positive for abdominal pain. Negative for nausea, vomiting, diarrhea, constipation, blood in stool, abdominal distention and rectal pain.  Genitourinary: Positive for vaginal bleeding and pelvic pain. Negative for dysuria, urgency, frequency, hematuria, flank pain, vaginal discharge, difficulty urinating and vaginal pain.  Musculoskeletal: Negative for back pain.  Skin: Negative for rash.  Neurological: Negative for weakness.  Hematological: Negative for adenopathy.  Psychiatric/Behavioral: Negative for confusion.  All other systems reviewed and are negative.    Allergies  Clindamycin/lincomycin; Doxycycline; and Penicillins  Home Medications   Current Outpatient Rx  Name  Route  Sig  Dispense  Refill  . etonogestrel-ethinyl estradiol (NUVARING) 0.12-0.015 MG/24HR vaginal ring   Vaginal   Place 1 each vaginally every 28 (twenty-eight) days. Insert vaginally and leave in place for 3 consecutive weeks, then remove for 1 week.         . estrogens, conjugated, (  PREMARIN) 1.25 MG tablet   Oral   Take 2 tablets (2.5 mg total) by mouth 2 (two) times daily.   30 tablet   0   . HYDROcodone-acetaminophen (NORCO/VICODIN) 5-325 MG per tablet   Oral   Take 1 tablet  by mouth every 4 (four) hours as needed for pain.   15 tablet   0   . ondansetron (ZOFRAN ODT) 8 MG disintegrating tablet      8mg  ODT q4 hours prn nausea   12 tablet   0    BP 138/82  Pulse 101  Temp(Src) 98.1 F (36.7 C) (Oral)  Resp 16  Ht 5\' 9"  (1.753 m)  Wt 200 lb (90.719 kg)  BMI 29.52 kg/m2  SpO2 98%  LMP 03/11/2013 Physical Exam  Nursing note and vitals reviewed. Constitutional: She is oriented to person, place, and time. She appears well-developed and well-nourished. No distress.  HENT:  Head: Normocephalic and atraumatic.  Mouth/Throat: Oropharynx is clear and moist.  Eyes: Conjunctivae and EOM are normal. Pupils are equal, round, and reactive to light. No scleral icterus.  Neck: Normal range of motion. Neck supple.  Cardiovascular: Normal rate, regular rhythm, normal heart sounds and intact distal pulses.   No murmur heard. Pulmonary/Chest: Effort normal and breath sounds normal. No respiratory distress. She has no wheezes. She has no rales. She exhibits no tenderness.  Abdominal: Soft. Normal appearance and bowel sounds are normal. She exhibits no mass. There is no hepatosplenomegaly. There is tenderness in the right lower quadrant. There is no rigidity, no rebound, no guarding and no CVA tenderness. Hernia confirmed negative in the right inguinal area and confirmed negative in the left inguinal area.    Genitourinary: Uterus normal. Pelvic exam was performed with patient supine. There is no rash, tenderness, lesion or injury on the right labia. There is no rash, tenderness, lesion or injury on the left labia. Uterus is not deviated, not enlarged, not fixed and not tender. Cervix exhibits no motion tenderness, no discharge and no friability. Right adnexum displays tenderness. Right adnexum displays no mass and no fullness. Left adnexum displays no mass, no tenderness and no fullness. There is bleeding around the vagina. No erythema or tenderness around the vagina. No  foreign body around the vagina. No signs of injury around the vagina. Vaginal discharge (thick, grey, mucous-like) found.  Musculoskeletal: Normal range of motion. She exhibits no edema.  Lymphadenopathy:    She has no cervical adenopathy.       Right: No inguinal adenopathy present.       Left: No inguinal adenopathy present.  Neurological: She is alert and oriented to person, place, and time. She exhibits normal muscle tone. Coordination normal.  Speech is clear and goal oriented Moves extremities without ataxia  Skin: Skin is warm and dry. No rash noted. She is not diaphoretic. No erythema.  Psychiatric: She has a normal mood and affect.    ED Course   Procedures (including critical care time)  Labs Reviewed  WET PREP, GENITAL - Abnormal; Notable for the following:    WBC, Wet Prep HPF POC FEW (*)    All other components within normal limits  BASIC METABOLIC PANEL - Abnormal; Notable for the following:    Glucose, Bld 104 (*)    All other components within normal limits  URINALYSIS, ROUTINE W REFLEX MICROSCOPIC - Abnormal; Notable for the following:    Specific Gravity, Urine 1.033 (*)    All other components within normal limits  GC/CHLAMYDIA  PROBE AMP  CBC  PROTIME-INR  APTT  POCT PREGNANCY, URINE   US Transvaginal Non-ob  03/25/2013   *RADIOLOGY REPORT*  Clinical Data: Right-sided pelvic pain, vaginal bleeding, evaluate for ovarian torsion  TRANSABDOMINAL AND TRANSVAGINAL ULTRASOUND OF PELVIS DOPPLER ULTRASOUND OF OVARIES  Technique:  Both transabdominal and transvaginal ultrasound examinations of the pelvis were performed. Transabdominal technique was performed for global imaging of the pelvis including uterus, ovaries, adnexal regions, and pelvic cul-de-sac.  It was necessary to proceed with endovaginal exam following the transabdominal exam to visualize the bilateral ovaries.  Color and duplex Doppler ultrasound was utilized to evaluate blood flow to the ovaries.   Comparison:  CT abdomen pelvis - 11/24/2012  Findings:  Uterus: Normal size and appearance of the anteverted uterus measuring approximately 8.3 x 3.8 x 3.2 cm. No discrete uterine mass or fibroid.  Endometrium: Normal in thickness and appearance  Right ovary:  Normal in size measuring 3.1 x 1.9 x 2.6 cm.  Note is made of a anechoic approximately 1.4 x 1.0 x 1.1 cm cyst within the right ovary.  Several additional sub centimeter follicles are also noted within the right ovary.  Normal arterial and venous wave forms are demonstrated within the right ovary.  Left ovary: Normal in size measuring approximately 2.2 x 1.6 x 2.0 cm.  Several small sub-centimeter anechoic follicles are noted within the left ovary.  Normal arterial and venous wave forms demonstrate within the left ovary.  Other findings: There is a trace amount of physiologic free fluid in the pelvic cul-de-sac.  IMPRESSION:  1.  No explanation for patient's right-sided pelvic pain and vaginal bleeding.  Specifically, no evidence of ovarian torsion. 2.  Note is made of an approximately 1.4 cm right-sided adnexal cyst.   This is almost certainly benign, and no specific imaging follow up is recommended according to the Society of Radiologists in Ultrasound 2010 Consensus  Conference Statement (D Lenis Noon et al. Management of Asymptomatic Ovarian and Other Adnexal Cysts Imaged at [place]:  Society of Radiologists in Ultrasound Consensus Conference Statement 2010.  Radiology 256 (Sept 2010): 943-954.).   Original Report Authenticated By: Tacey Ruiz, MD   US Pelvis Complete  03/25/2013   *RADIOLOGY REPORT*  Clinical Data: Right-sided pelvic pain, vaginal bleeding, evaluate for ovarian torsion  TRANSABDOMINAL AND TRANSVAGINAL ULTRASOUND OF PELVIS DOPPLER ULTRASOUND OF OVARIES  Technique:  Both transabdominal and transvaginal ultrasound examinations of the pelvis were performed. Transabdominal technique was performed for global imaging of the pelvis including uterus,  ovaries, adnexal regions, and pelvic cul-de-sac.  It was necessary to proceed with endovaginal exam following the transabdominal exam to visualize the bilateral ovaries.  Color and duplex Doppler ultrasound was utilized to evaluate blood flow to the ovaries.  Comparison:  CT abdomen pelvis - 11/24/2012  Findings:  Uterus: Normal size and appearance of the anteverted uterus measuring approximately 8.3 x 3.8 x 3.2 cm. No discrete uterine mass or fibroid.  Endometrium: Normal in thickness and appearance  Right ovary:  Normal in size measuring 3.1 x 1.9 x 2.6 cm.  Note is made of a anechoic approximately 1.4 x 1.0 x 1.1 cm cyst within the right ovary.  Several additional sub centimeter follicles are also noted within the right ovary.  Normal arterial and venous wave forms are demonstrated within the right ovary.  Left ovary: Normal in size measuring approximately 2.2 x 1.6 x 2.0 cm.  Several small sub-centimeter anechoic follicles are noted within the left ovary.  Normal arterial and  venous wave forms demonstrate within the left ovary.  Other findings: There is a trace amount of physiologic free fluid in the pelvic cul-de-sac.  IMPRESSION:  1.  No explanation for patient's right-sided pelvic pain and vaginal bleeding.  Specifically, no evidence of ovarian torsion. 2.  Note is made of an approximately 1.4 cm right-sided adnexal cyst.   This is almost certainly benign, and no specific imaging follow up is recommended according to the Society of Radiologists in Ultrasound 2010 Consensus  Conference Statement (D Lenis Noon et al. Management of Asymptomatic Ovarian and Other Adnexal Cysts Imaged at [place]:  Society of Radiologists in Ultrasound Consensus Conference Statement 2010.  Radiology 256 (Sept 2010): [phone].).   Original Report Authenticated By: Tacey Ruiz, MD   [place] Art/ven Flow Abd Pelv Doppler  03/25/2013   *RADIOLOGY REPORT*  Clinical Data: Right-sided pelvic pain, vaginal bleeding, evaluate for ovarian torsion   TRANSABDOMINAL AND TRANSVAGINAL ULTRASOUND OF PELVIS DOPPLER ULTRASOUND OF OVARIES  Technique:  Both transabdominal and transvaginal ultrasound examinations of the pelvis were performed. Transabdominal technique was performed for global imaging of the pelvis including uterus, ovaries, adnexal regions, and pelvic cul-de-sac.  It was necessary to proceed with endovaginal exam following the transabdominal exam to visualize the bilateral ovaries.  Color and duplex Doppler ultrasound was utilized to evaluate blood flow to the ovaries.  Comparison:  CT abdomen pelvis - 11/24/2012  Findings:  Uterus: Normal size and appearance of the anteverted uterus measuring approximately 8.3 x 3.8 x 3.2 cm. No discrete uterine mass or fibroid.  Endometrium: Normal in thickness and appearance  Right ovary:  Normal in size measuring 3.1 x 1.9 x 2.6 cm.  Note is made of a anechoic approximately 1.4 x 1.0 x 1.1 cm cyst within the right ovary.  Several additional sub centimeter follicles are also noted within the right ovary.  Normal arterial and venous wave forms are demonstrated within the right ovary.  Left ovary: Normal in size measuring approximately 2.2 x 1.6 x 2.0 cm.  Several small sub-centimeter anechoic follicles are noted within the left ovary.  Normal arterial and venous wave forms demonstrate within the left ovary.  Other findings: There is a trace amount of physiologic free fluid in the pelvic cul-de-sac.  IMPRESSION:  1.  No explanation for patient's right-sided pelvic pain and vaginal bleeding.  Specifically, no evidence of ovarian torsion. 2.  Note is made of an approximately 1.4 cm right-sided adnexal cyst.   This is almost certainly benign, and no specific imaging follow up is recommended according to the Society of Radiologists in Ultrasound 2010 Consensus  Conference Statement (D Lenis Noon et al. Management of Asymptomatic Ovarian and Other Adnexal Cysts Imaged at [place]:  Society of Radiologists in Ultrasound Consensus  Conference Statement 2010.  Radiology 256 (Sept 2010): [phone].).   Original Report Authenticated By: Tacey Ruiz, MD   1. Dysfunctional uterine bleeding   2. Pelvic pain     MDM  Karla Stafford presents with dysfunctional uterine bleeding after NuvaRing use and mild right lower pelvic pain.  Urinalysis without evidence of urinary tract infection, CBC without leukocytosis BMP unremarkable. Coagulation studies (PT/INR and aPTT) all within normal limits. Wet prep with a few white blood cells but not at risk for STD at this time.  Ultrasound with 1.4cm right-sided adnexal cyst, but no further abnormal findings.    Patient pain control department. We'll discharge home with 15 day course of Premarin and instructions to followup closely with OB/GYN. She has  been given resource guide.  Patient hemodynamically stable, vital signs within normal limits and stable throughout time here in the emergency department. Patient is nontoxic nonseptic appearing. No anemia noted on CBC.  I have also discussed reasons to return immediately to the ER.  Patient expresses understanding and agrees with plan.    Dahlia Client Spruha Weight, PA-C 03/25/13 1953

## 2013-03-26 LAB — GC/CHLAMYDIA PROBE AMP: GC Probe RNA: NEGATIVE

## 2013-03-27 NOTE — ED Provider Notes (Signed)
Medical screening examination/treatment/procedure(s) were performed by non-physician practitioner and as supervising physician I was immediately available for consultation/collaboration.   Nijah Orlich E Shella Lahman, MD 03/27/13 0933 

## 2013-04-11 ENCOUNTER — Inpatient Hospital Stay (HOSPITAL_COMMUNITY)
Admission: AD | Admit: 2013-04-11 | Discharge: 2013-04-12 | Disposition: A | Discharge status: Home / Self Care | Source: Ambulatory Visit | Attending: Obstetrics & Gynecology | Admitting: Obstetrics & Gynecology

## 2013-04-11 ENCOUNTER — Encounter (HOSPITAL_COMMUNITY): Payer: Self-pay | Admitting: *Deleted

## 2013-04-11 DIAGNOSIS — N83209 Unspecified ovarian cyst, unspecified side: Secondary | ICD-10-CM | POA: Insufficient documentation

## 2013-04-11 DIAGNOSIS — R1031 Right lower quadrant pain: Secondary | ICD-10-CM | POA: Insufficient documentation

## 2013-04-11 HISTORY — DX: Unspecified ovarian cyst, unspecified side: N83.209

## 2013-04-11 LAB — URINALYSIS, ROUTINE W REFLEX MICROSCOPIC
Bilirubin Urine: NEGATIVE
Hgb urine dipstick: NEGATIVE
Ketones, ur: 15 mg/dL — AB
Protein, ur: NEGATIVE mg/dL
Urobilinogen, UA: 0.2 mg/dL (ref 0.0–1.0)

## 2013-04-11 LAB — POCT PREGNANCY, URINE: Preg Test, Ur: NEGATIVE

## 2013-04-11 NOTE — MAU Provider Note (Signed)
Chief Complaint: Abdominal Pain   First Provider Initiated Contact with Patient 04/12/13 0049     SUBJECTIVE HPI: Karla Stafford is a 22 y.o. G1P1 female who presents with  RLQ pain today. Seen at [facility] 03/25/13 for AUB. US showed small ovarian cyst and multiple small follicles. Neg GC/CT , Wet prep.    Past Medical History  Diagnosis Date  . Ovarian cyst    OB History  Gravida Para Term Preterm AB SAB TAB Ectopic Multiple Living  1 1        1     # Outcome Date GA Lbr Len/2nd Weight Sex Delivery Anes PTL Lv  1 PAR              Past Surgical History  Procedure Laterality Date  . Cholecystectomy     History   Social History  . Marital Status: Single    Spouse Name: N/A    Number of Children: N/A  . Years of Education: N/A   Occupational History  . Not on file.   Social History Main Topics  . Smoking status: Current Every Day Smoker  . Smokeless tobacco: Not on file  . Alcohol Use: No  . Drug Use: No  . Sexual Activity: Yes    Birth Control/ Protection: Inserts   Other Topics Concern  . Not on file   Social History Narrative  . No narrative on file   No current facility-administered medications on file prior to encounter.   Current Outpatient Prescriptions on File Prior to Encounter  Medication Sig Dispense Refill  . estrogens, conjugated, (PREMARIN) 1.25 MG tablet Take 2 tablets (2.5 mg total) by mouth 2 (two) times daily.  30 tablet  0  . etonogestrel-ethinyl estradiol (NUVARING) 0.12-0.015 MG/24HR vaginal ring Place 1 each vaginally every 28 (twenty-eight) days. Insert vaginally and leave in place for 3 consecutive weeks, then remove for 1 week.      Marland Kitchen HYDROcodone-acetaminophen (NORCO/VICODIN) 5-325 MG per tablet Take 1 tablet by mouth every 4 (four) hours as needed for pain.  15 tablet  0  . ondansetron (ZOFRAN ODT) 8 MG disintegrating tablet 8mg  ODT q4 hours prn nausea  12 tablet  0   Allergies  Allergen Reactions  . Clindamycin/Lincomycin Rash  .  Doxycycline Rash  . Penicillins Rash    ROS: Pertinent items in HPI  OBJECTIVE Blood pressure 117/61, pulse 54, temperature 98.2 F (36.8 C), temperature source Oral, resp. rate 18, height 5\' 9"  (1.753 m), weight 90.719 kg (200 lb), last menstrual period 03/11/2013, SpO2 100.00%. GENERAL: Well-developed, well-nourished female in moderate distress.  HEENT: Normocephalic HEART: normal rate RESP: normal effort ABDOMEN: Soft, RLQ tenderness w/ rebound tenderness. Pos BS. No Mass EXTREMITIES: Nontender, no edema NEURO: Alert and oriented SPECULUM EXAM: Declined.   LAB RESULTS Results for orders placed during the hospital encounter of 04/11/13 (from the past 24 hour(s))  URINALYSIS, ROUTINE W REFLEX MICROSCOPIC     Status: Abnormal   Collection Time    04/11/13 11:25 PM      Result Value Range   Color, Urine YELLOW  YELLOW   APPearance CLOUDY (*) CLEAR   Specific Gravity, Urine >1.030 (*) 1.005 - 1.030   pH 5.5  5.0 - 8.0   Glucose, UA NEGATIVE  NEGATIVE mg/dL   Hgb urine dipstick NEGATIVE  NEGATIVE   Bilirubin Urine NEGATIVE  NEGATIVE   Ketones, ur 15 (*) NEGATIVE mg/dL   Protein, ur NEGATIVE  NEGATIVE mg/dL   Urobilinogen, UA 0.2  0.0 - 1.0 mg/dL   Nitrite NEGATIVE  NEGATIVE   Leukocytes, UA NEGATIVE  NEGATIVE  POCT PREGNANCY, URINE     Status: None   Collection Time    04/11/13 11:30 PM      Result Value Range   Preg Test, Ur NEGATIVE  NEGATIVE  CBC     Status: Abnormal   Collection Time    04/12/13 12:22 AM      Result Value Range   WBC 12.1 (*) 4.0 - 10.5 K/uL   RBC 4.12  3.87 - 5.11 MIL/uL   Hemoglobin 12.2  12.0 - 15.0 g/dL   HCT 16.1  09.6 - 04.5 %   MCV 87.4  78.0 - 100.0 fL   MCH 29.6  26.0 - 34.0 pg   MCHC 33.9  30.0 - 36.0 g/dL   RDW 40.9  81.1 - 91.4 %   Platelets 302  150 - 400 K/uL    IMAGING US Transvaginal Non-ob  03/25/2013   *RADIOLOGY REPORT*  Clinical Data: Right-sided pelvic pain, vaginal bleeding, evaluate for ovarian torsion  TRANSABDOMINAL  AND TRANSVAGINAL ULTRASOUND OF PELVIS DOPPLER ULTRASOUND OF OVARIES  Technique:  Both transabdominal and transvaginal ultrasound examinations of the pelvis were performed. Transabdominal technique was performed for global imaging of the pelvis including uterus, ovaries, adnexal regions, and pelvic cul-de-sac.  It was necessary to proceed with endovaginal exam following the transabdominal exam to visualize the bilateral ovaries.  Color and duplex Doppler ultrasound was utilized to evaluate blood flow to the ovaries.  Comparison:  CT abdomen pelvis - 11/24/2012  Findings:  Uterus: Normal size and appearance of the anteverted uterus measuring approximately 8.3 x 3.8 x 3.2 cm. No discrete uterine mass or fibroid.  Endometrium: Normal in thickness and appearance  Right ovary:  Normal in size measuring 3.1 x 1.9 x 2.6 cm.  Note is made of a anechoic approximately 1.4 x 1.0 x 1.1 cm cyst within the right ovary.  Several additional sub centimeter follicles are also noted within the right ovary.  Normal arterial and venous wave forms are demonstrated within the right ovary.  Left ovary: Normal in size measuring approximately 2.2 x 1.6 x 2.0 cm.  Several small sub-centimeter anechoic follicles are noted within the left ovary.  Normal arterial and venous wave forms demonstrate within the left ovary.  Other findings: There is a trace amount of physiologic free fluid in the pelvic cul-de-sac.  IMPRESSION:  1.  No explanation for patient's right-sided pelvic pain and vaginal bleeding.  Specifically, no evidence of ovarian torsion. 2.  Note is made of an approximately 1.4 cm right-sided adnexal cyst.   This is almost certainly benign, and no specific imaging follow up is recommended according to the Society of Radiologists in Ultrasound 2010 Consensus  Conference Statement (D Lenis Noon et al. Management of Asymptomatic Ovarian and Other Adnexal Cysts Imaged at [place]:  Society of Radiologists in Ultrasound Consensus Conference Statement  2010.  Radiology 256 (Sept 2010): 943-954.).   Original Report Authenticated By: Tacey Ruiz, MD   Ct Abdomen Pelvis W Contrast  04/12/2013   *RADIOLOGY REPORT*  Clinical Data: Right lower quadrant abdominal pain.  Evaluate for appendicitis.  CT ABDOMEN AND PELVIS WITH CONTRAST  Technique:  Multidetector CT imaging of the abdomen and pelvis was performed following the standard protocol during bolus administration of intravenous contrast.  Contrast: OMNIPAQUE IOHEXOL 300 MG/ML  SOLN  Comparison: CT of the abdomen and pelvis 11/24/2012.  Findings:  Lung Bases: Unremarkable.  Abdomen/Pelvis:  Status post cholecystectomy.  The appearance of the liver, pancreas, spleen, bilateral adrenal glands and the right kidney is unremarkable.  Subcentimeter low attenuation lesion in the lower pole of the left kidney is too small to definitively characterize, but is statistically likely represent a tiny cyst. Normal appendix.  No significant volume of ascites.  No pneumoperitoneum.  No pathologic distension of small bowel.  No definite pathologic lymphadenopathy identified within the abdomen or pelvis.  Uterus and ovaries are unremarkable in appearance. Urinary bladder is normal in appearance.  Musculoskeletal: There are no aggressive appearing lytic or blastic lesions noted in the visualized portions of the skeleton.  IMPRESSION: 1.  No acute findings in the abdomen or pelvis to account for the patient's symptoms. 2.  Specifically, the appendix is normal. 3.  Status post cholecystectomy.   Original Report Authenticated By: Trudie Reed, M.D.   MAU COURSE 0100: Discussed patient symptoms, exam and prior ED evaluation with Dr. Despina Hidden. Agrees with plan for CT if leukocytosis present.  0445: Pain improved with Percocet but is returning. Patient in no apparent distress. Now 8/10. Reviewed normal CT results. No GYN etiology for pain evident on CT, but cannot exclude the possibility that patient had a ruptured ovarian cyst  today. Offered discharge home with pain medication for transfer to [place] for further evaluation. Patient prefers discharge home. Given dose of tramadol and Toradol before discharge.  ASSESSMENT 1. Right lower quadrant pain     PLAN Discharge home in stable condition.     Follow-up Information   Follow up with [facility]. (As needed if symptoms continue)    Contact information:   [address] [place] [address] [phone]      Follow up with [facility]. (As needed in emergency)    Contact information:   [address] [place] [address]        Medication List         estrogens (conjugated) 1.25 MG tablet  Commonly known as:  PREMARIN  Take 2 tablets (2.5 mg total) by mouth 2 (two) times daily.     etonogestrel-ethinyl estradiol 0.12-0.015 MG/24HR vaginal ring  Commonly known as:  NUVARING  Place 1 each vaginally every 28 (twenty-eight) days. Insert vaginally and leave in place for 3 consecutive weeks, then remove for 1 week.     HYDROcodone-acetaminophen 5-325 MG per tablet  Commonly known as:  NORCO/VICODIN  Take 1 tablet by mouth every 4 (four) hours as needed for pain.     ketorolac 10 MG tablet  Commonly known as:  TORADOL  Take 1 tablet (10 mg total) by mouth every 6 (six) hours as needed for pain.     ondansetron 8 MG disintegrating tablet  Commonly known as:  ZOFRAN ODT  8mg  ODT q4 hours prn nausea     traMADol 50 MG tablet  Commonly known as:  ULTRAM  Take 1-2 tablets (50-100 mg total) by mouth every 6 (six) hours as needed for pain.       [place], CNM 04/12/2013  5:21 AM

## 2013-04-11 NOTE — MAU Note (Signed)
Pt reports pain lower right side all day today, worsened this pm. Seen at [facility] 2 weeks ago for dysfunctional uterine bleeding and has an appointment on 08/27 with GYN clinic . Pain is a new symptom

## 2013-04-12 ENCOUNTER — Encounter (HOSPITAL_COMMUNITY): Payer: Self-pay

## 2013-04-12 ENCOUNTER — Inpatient Hospital Stay (HOSPITAL_COMMUNITY)

## 2013-04-12 DIAGNOSIS — R1031 Right lower quadrant pain: Secondary | ICD-10-CM

## 2013-04-12 LAB — CBC
HCT: 36 % (ref 36.0–46.0)
MCH: 29.6 pg (ref 26.0–34.0)
MCHC: 33.9 g/dL (ref 30.0–36.0)
MCV: 87.4 fL (ref 78.0–100.0)
RDW: 13.1 % (ref 11.5–15.5)

## 2013-04-12 MED ORDER — ONDANSETRON HCL 4 MG/2ML IJ SOLN
4.0000 mg | Freq: Once | INTRAMUSCULAR | Status: AC | PRN
  Administered 2013-04-12: 4 mg via INTRAVENOUS
  Filled 2013-04-12: qty 2

## 2013-04-12 MED ORDER — KETOROLAC TROMETHAMINE 10 MG PO TABS
10.0000 mg | ORAL_TABLET | Freq: Once | ORAL | Status: AC
  Administered 2013-04-12: 10 mg via ORAL
  Filled 2013-04-12: qty 1

## 2013-04-12 MED ORDER — TRAMADOL HCL 50 MG PO TABS: 50.0000 mg | ORAL_TABLET | Freq: Four times a day (QID) | ORAL | Status: AC | PRN

## 2013-04-12 MED ORDER — TRAMADOL HCL 50 MG PO TABS
100.0000 mg | ORAL_TABLET | Freq: Once | ORAL | Status: AC
  Administered 2013-04-12: 100 mg via ORAL
  Filled 2013-04-12 (×2): qty 1

## 2013-04-12 MED ORDER — KETOROLAC TROMETHAMINE 10 MG PO TABS: 10.0000 mg | ORAL_TABLET | Freq: Four times a day (QID) | ORAL | Status: DC | PRN

## 2013-04-12 MED ORDER — IOHEXOL 300 MG/ML  SOLN
50.0000 mL | INTRAMUSCULAR | Status: AC
  Administered 2013-04-12: 50 mL via ORAL

## 2013-04-12 MED ORDER — OXYCODONE-ACETAMINOPHEN 5-325 MG PO TABS
2.0000 | ORAL_TABLET | Freq: Once | ORAL | Status: AC
  Administered 2013-04-12: 2 via ORAL
  Filled 2013-04-12: qty 2

## 2013-04-12 MED ORDER — IOHEXOL 300 MG/ML  SOLN: 50.0000 mL | INTRAMUSCULAR | Status: DC

## 2013-04-12 MED ORDER — IOHEXOL 300 MG/ML  SOLN
100.0000 mL | Freq: Once | INTRAMUSCULAR | Status: AC | PRN
  Administered 2013-04-12: 100 mL via INTRAVENOUS

## 2013-04-25 ENCOUNTER — Ambulatory Visit (INDEPENDENT_AMBULATORY_CARE_PROVIDER_SITE_OTHER): Admitting: Obstetrics & Gynecology

## 2013-04-25 ENCOUNTER — Encounter: Payer: Self-pay | Admitting: Obstetrics & Gynecology

## 2013-04-25 VITALS — BP 113/79 | HR 101 | Ht 69.0 in | Wt 203.7 lb

## 2013-04-25 DIAGNOSIS — N921 Excessive and frequent menstruation with irregular cycle: Secondary | ICD-10-CM

## 2013-04-25 NOTE — Progress Notes (Signed)
Patient ID: Karla Stafford, female   DOB: [birth year], 22 y.o.   MRN: [id number]     Chief Complaint: Abdominal Pain    First Provider Initiated Contact with Patient 04/12/13 0049       SUBJECTIVE HPI: SALOTE WEIDMANN is a 22 y.o. G1P1 female who presents with  RLQ pain today. Seen at [facility] 03/25/13 for AUB. US showed small ovarian cyst and multiple small follicles. Neg GC/CT , Wet prep.   Feels better, bleeding normal now, will be off her Nuvaring    Past Medical History   Diagnosis  Date   .  Ovarian cyst         OB History   Gravida  Para  Term  Preterm  AB  SAB  TAB  Ectopic  Multiple  Living   1  1                1       #  Outcome  Date  GA  Lbr Len/2nd  Weight  Sex  Delivery  Anes  PTL  Lv   1  PAR                            Past Surgical History   Procedure  Laterality  Date   .  Cholecystectomy           History       Social History   .  Marital Status:  Single       Spouse Name:  N/A       Number of Children:  N/A   .  Years of Education:  N/A       Occupational History   .  Not on file.       Social History Main Topics   .  Smoking status:  Current Every Day Smoker   .  Smokeless tobacco:  Not on file   .  Alcohol Use:  No   .  Drug Use:  No   .  Sexual Activity:  Yes       Birth Control/ Protection:  Inserts       Other Topics  Concern   .  Not on file       Social History Narrative   .  No narrative on file       No current facility-administered medications on file prior to encounter.       Current Outpatient Prescriptions on File Prior to Encounter   Medication  Sig  Dispense  Refill   .  estrogens, conjugated, (PREMARIN) 1.25 MG tablet  Take 2 tablets (2.5 mg total) by mouth 2 (two) times daily.   30 tablet   0   .  etonogestrel-ethinyl estradiol (NUVARING) 0.12-0.015 MG/24HR vaginal ring  Place 1 each vaginally every 28 (twenty-eight) days. Insert vaginally and leave in place for 3 consecutive weeks, then remove for 1 week.         Marland Kitchen   HYDROcodone-acetaminophen (NORCO/VICODIN) 5-325 MG per tablet  Take 1 tablet by mouth every 4 (four) hours as needed for pain.   15 tablet   0   .  ondansetron (ZOFRAN ODT) 8 MG disintegrating tablet  8mg  ODT q4 hours prn nausea   12 tablet   0       Allergies   Allergen  Reactions   .  Clindamycin/Lincomycin  Rash   .  Doxycycline  Rash   .  Penicillins  Rash        ROS: Pertinent items in HPI   OBJECTIVE Blood pressure 117/61, pulse 54, temperature 98.2 F (36.8 C), temperature source Oral, resp. rate 18, height 5\' 9"  (1.753 m), weight 90.719 kg (200 lb), last menstrual period 03/11/2013, SpO2 100.00%. GENERAL: Well-developed, well-nourished female in moderate distress.   HEENT: Normocephalic  RESP: normal effort   NEURO: Alert and oriented SPECULUM EXAM: deferred    LAB RESULTS Results for orders placed during the hospital encounter of 04/11/13 (from the past 24 hour(s))   URINALYSIS, ROUTINE W REFLEX MICROSCOPIC     Status: Abnormal     Collection Time      04/11/13 11:25 PM       Result  Value  Range     Color, Urine  YELLOW   YELLOW     APPearance  CLOUDY (*)  CLEAR     Specific Gravity, Urine  >1.030 (*)  1.005 - 1.030     pH  5.5   5.0 - 8.0     Glucose, UA  NEGATIVE   NEGATIVE mg/dL     Hgb urine dipstick  NEGATIVE   NEGATIVE     Bilirubin Urine  NEGATIVE   NEGATIVE     Ketones, ur  15 (*)  NEGATIVE mg/dL     Protein, ur  NEGATIVE   NEGATIVE mg/dL     Urobilinogen, UA  0.2   0.0 - 1.0 mg/dL     Nitrite  NEGATIVE   NEGATIVE     Leukocytes, UA  NEGATIVE   NEGATIVE   POCT PREGNANCY, URINE     Status: None     Collection Time      04/11/13 11:30 PM       Result  Value  Range     Preg Test, Ur  NEGATIVE   NEGATIVE   CBC     Status: Abnormal     Collection Time      04/12/13 12:22 AM       Result  Value  Range     WBC  12.1 (*)  4.0 - 10.5 K/uL     RBC  4.12   3.87 - 5.11 MIL/uL     Hemoglobin  12.2   12.0 - 15.0 g/dL     HCT  96.0   45.4 - 46.0 %      MCV  87.4   78.0 - 100.0 fL     MCH  29.6   26.0 - 34.0 pg     MCHC  33.9   30.0 - 36.0 g/dL     RDW  09.8   11.9 - 15.5 %     Platelets  302   150 - 400 K/uL        IMAGING US Transvaginal Non-ob   03/25/2013   *RADIOLOGY REPORT*  Clinical Data: Right-sided pelvic pain, vaginal bleeding, evaluate for ovarian torsion  TRANSABDOMINAL AND TRANSVAGINAL ULTRASOUND OF PELVIS DOPPLER ULTRASOUND OF OVARIES  Technique:  Both transabdominal and transvaginal ultrasound examinations of the pelvis were performed. Transabdominal technique was performed for global imaging of the pelvis including uterus, ovaries, adnexal regions, and pelvic cul-de-sac.  It was necessary to proceed with endovaginal exam following the transabdominal exam to visualize the bilateral ovaries.  Color and duplex Doppler ultrasound was utilized to evaluate blood flow to the ovaries.  Comparison:  CT abdomen pelvis - 11/24/2012  Findings:  Uterus: Normal size and appearance of the anteverted uterus measuring  approximately 8.3 x 3.8 x 3.2 cm. No discrete uterine mass or fibroid.  Endometrium: Normal in thickness and appearance  Right ovary:  Normal in size measuring 3.1 x 1.9 x 2.6 cm.  Note is made of a anechoic approximately 1.4 x 1.0 x 1.1 cm cyst within the right ovary.  Several additional sub centimeter follicles are also noted within the right ovary.  Normal arterial and venous wave forms are demonstrated within the right ovary.  Left ovary: Normal in size measuring approximately 2.2 x 1.6 x 2.0 cm.  Several small sub-centimeter anechoic follicles are noted within the left ovary.  Normal arterial and venous wave forms demonstrate within the left ovary.  Other findings: There is a trace amount of physiologic free fluid in the pelvic cul-de-sac.  IMPRESSION:  1.  No explanation for patient's right-sided pelvic pain and vaginal bleeding.  Specifically, no evidence of ovarian torsion. 2.  Note is made of an approximately 1.4 cm right-sided  adnexal cyst.   This is almost certainly benign, and no specific imaging follow up is recommended according to the Society of Radiologists in Ultrasound 2010 Consensus  Conference Statement (D Lenis Noon et al. Management of Asymptomatic Ovarian and Other Adnexal Cysts Imaged at [place]:  Society of Radiologists in Ultrasound Consensus Conference Statement 2010.  Radiology 256 (Sept 2010): 943-954.).   Original Report Authenticated By: Tacey Ruiz, MD    Ct Abdomen Pelvis W Contrast   04/12/2013   *RADIOLOGY REPORT*  Clinical Data: Right lower quadrant abdominal pain.  Evaluate for appendicitis.  CT ABDOMEN AND PELVIS WITH CONTRAST  Technique:  Multidetector CT imaging of the abdomen and pelvis was performed following the standard protocol during bolus administration of intravenous contrast.  Contrast: OMNIPAQUE IOHEXOL 300 MG/ML  SOLN  Comparison: CT of the abdomen and pelvis 11/24/2012.  Findings:  Lung Bases: Unremarkable.  Abdomen/Pelvis:  Status post cholecystectomy.  The appearance of the liver, pancreas, spleen, bilateral adrenal glands and the right kidney is unremarkable.  Subcentimeter low attenuation lesion in the lower pole of the left kidney is too small to definitively characterize, but is statistically likely represent a tiny cyst. Normal appendix.  No significant volume of ascites.  No pneumoperitoneum.  No pathologic distension of small bowel.  No definite pathologic lymphadenopathy identified within the abdomen or pelvis.  Uterus and ovaries are unremarkable in appearance. Urinary bladder is normal in appearance.  Musculoskeletal: There are no aggressive appearing lytic or blastic lesions noted in the visualized portions of the skeleton.  IMPRESSION: 1.  No acute findings in the abdomen or pelvis to account for the patient's symptoms. 2.  Specifically, the appendix is normal. 3.  Status post cholecystectomy.   Original Report Authenticated By: Trudie Reed, M.D.     MAU COURSE 0100:  Discussed patient symptoms, exam and prior ED evaluation with Dr. Despina Hidden. Agrees with plan for CT if leukocytosis present.   0445: Pain improved with Percocet but is returning. Patient in no apparent distress. Now 8/10. Reviewed normal CT results. No GYN etiology for pain evident on CT, but cannot exclude the possibility that patient had a ruptured ovarian cyst today. Offered discharge home with pain medication for transfer to [place] for further evaluation. Patient prefers discharge home. Given dose of tramadol and Toradol before discharge.   ASSESSMENT 1.  Right lower quadrant pain  Resolved 2. AUB resolved, wants to be off Nuvaring for now 3. Plans to return to Dr. Maud Deed F/u  Dr. Clearance Coots                     Medication List        etonogestrel-ethinyl estradiol 0.12-0.015 MG/24HR vaginal ring   Commonly known as:  NUVARING   Place 1 each vaginally every 28 (twenty-eight) days. Insert vaginally and leave in place for 3 consecutive weeks, then remove for 1 week.         HYDROcodone-acetaminophen 5-325 MG per tablet   Commonly known as:  NORCO/VICODIN   Take 1 tablet by mouth every 4 (four) hours as needed for pain.         ketorolac 10 MG tablet   Commonly known as:  TORADOL   Take 1 tablet (10 mg total) by mouth every 6 (six) hours as needed for pain.         ondansetron 8 MG disintegrating tablet   Commonly known as:  ZOFRAN ODT   8mg  ODT q4 hours prn nausea         traMADol 50 MG tablet   Commonly known as:  ULTRAM   Take 1-2 tablets (50-100 mg total) by mouth every 6 (six) hours as needed for pain.       Marland Kitchenext 04/25/2013

## 2013-04-25 NOTE — Patient Instructions (Addendum)

## 2013-04-26 ENCOUNTER — Encounter: Payer: Self-pay | Admitting: Obstetrics & Gynecology

## 2013-07-05 ENCOUNTER — Other Ambulatory Visit: Payer: Self-pay

## 2013-08-05 ENCOUNTER — Emergency Department: Payer: Self-pay | Admitting: Internal Medicine

## 2013-08-05 LAB — RAPID INFLUENZA A&B ANTIGENS

## 2014-07-01 ENCOUNTER — Encounter: Payer: Self-pay | Admitting: Obstetrics & Gynecology

## 2014-11-28 ENCOUNTER — Telehealth: Payer: Self-pay | Admitting: *Deleted

## 2014-11-28 NOTE — Telephone Encounter (Addendum)
Patient is interested in scheduling a NOB appointment. Attempted to contact patient and unable to leave voicemail because the mailbox is full.

## 2014-11-28 NOTE — Telephone Encounter (Signed)
Patient contacted the office. Patient states she has verified her pregnancy at her PCP. Patient advised she would need to bring the verification to the office at the time of her appointment. Patient states her LMP is 10-16-14. Patient advsied she is approximately 6.1 weeks today with an estimated due date of 07-23-15. Patient states she has medicaid and t is active. Patient advised we schedule the first ob appointment between 10-12 weeks. Patient given an appointment for 01-01-15 @ 1 pm. Patient advised of no show policy and verbalized understanding. [facility]. Patient encourage to continue her prenatal vitamins.

## 2014-11-30 ENCOUNTER — Inpatient Hospital Stay (HOSPITAL_COMMUNITY)
Admission: AD | Admit: 2014-11-30 | Discharge: 2014-12-01 | Disposition: A | Discharge status: Home / Self Care | Payer: Medicaid Other | Source: Ambulatory Visit | Attending: Obstetrics | Admitting: Obstetrics

## 2014-11-30 DIAGNOSIS — O99331 Smoking (tobacco) complicating pregnancy, first trimester: Secondary | ICD-10-CM | POA: Insufficient documentation

## 2014-11-30 DIAGNOSIS — F17210 Nicotine dependence, cigarettes, uncomplicated: Secondary | ICD-10-CM | POA: Insufficient documentation

## 2014-11-30 DIAGNOSIS — R109 Unspecified abdominal pain: Secondary | ICD-10-CM | POA: Insufficient documentation

## 2014-11-30 DIAGNOSIS — O200 Threatened abortion: Secondary | ICD-10-CM | POA: Insufficient documentation

## 2014-11-30 DIAGNOSIS — Z3A01 Less than 8 weeks gestation of pregnancy: Secondary | ICD-10-CM | POA: Insufficient documentation

## 2014-11-30 DIAGNOSIS — O209 Hemorrhage in early pregnancy, unspecified: Secondary | ICD-10-CM

## 2014-11-30 DIAGNOSIS — O26899 Other specified pregnancy related conditions, unspecified trimester: Secondary | ICD-10-CM

## 2014-11-30 HISTORY — DX: Anemia, unspecified: D64.9

## 2014-12-01 ENCOUNTER — Inpatient Hospital Stay (HOSPITAL_COMMUNITY): Payer: Medicaid Other

## 2014-12-01 ENCOUNTER — Encounter (HOSPITAL_COMMUNITY): Payer: Self-pay

## 2014-12-01 DIAGNOSIS — O99331 Smoking (tobacco) complicating pregnancy, first trimester: Secondary | ICD-10-CM | POA: Diagnosis not present

## 2014-12-01 DIAGNOSIS — O200 Threatened abortion: Secondary | ICD-10-CM | POA: Diagnosis not present

## 2014-12-01 DIAGNOSIS — Z3A01 Less than 8 weeks gestation of pregnancy: Secondary | ICD-10-CM | POA: Diagnosis not present

## 2014-12-01 DIAGNOSIS — O9989 Other specified diseases and conditions complicating pregnancy, childbirth and the puerperium: Secondary | ICD-10-CM

## 2014-12-01 DIAGNOSIS — F17210 Nicotine dependence, cigarettes, uncomplicated: Secondary | ICD-10-CM | POA: Diagnosis not present

## 2014-12-01 DIAGNOSIS — R109 Unspecified abdominal pain: Secondary | ICD-10-CM | POA: Diagnosis present

## 2014-12-01 LAB — WET PREP, GENITAL
TRICH WET PREP: NONE SEEN
YEAST WET PREP: NONE SEEN

## 2014-12-01 LAB — URINALYSIS, ROUTINE W REFLEX MICROSCOPIC
BILIRUBIN URINE: NEGATIVE
Glucose, UA: NEGATIVE mg/dL
HGB URINE DIPSTICK: NEGATIVE
Ketones, ur: NEGATIVE mg/dL
Leukocytes, UA: NEGATIVE
NITRITE: NEGATIVE
PH: 6 (ref 5.0–8.0)
Protein, ur: NEGATIVE mg/dL
UROBILINOGEN UA: 0.2 mg/dL (ref 0.0–1.0)

## 2014-12-01 LAB — CBC
HEMATOCRIT: 40.6 % (ref 36.0–46.0)
HEMOGLOBIN: 13.9 g/dL (ref 12.0–15.0)
MCH: 31.2 pg (ref 26.0–34.0)
MCHC: 34.2 g/dL (ref 30.0–36.0)
MCV: 91.2 fL (ref 78.0–100.0)
PLATELETS: 368 10*3/uL (ref 150–400)
RBC: 4.45 MIL/uL (ref 3.87–5.11)
RDW: 12.9 % (ref 11.5–15.5)
WBC: 15.4 10*3/uL — ABNORMAL HIGH (ref 4.0–10.5)

## 2014-12-01 LAB — HCG, QUANTITATIVE, PREGNANCY: hCG, Beta Chain, Quant, S: 1272 m[IU]/mL — ABNORMAL HIGH (ref <5)

## 2014-12-01 LAB — POCT PREGNANCY, URINE: Preg Test, Ur: POSITIVE — AB

## 2014-12-01 LAB — ABO/RH: ABO/RH(D): A POS

## 2014-12-01 LAB — HIV ANTIBODY (ROUTINE TESTING W REFLEX): HIV SCREEN 4TH GENERATION: NONREACTIVE

## 2014-12-01 MED ORDER — PROMETHAZINE HCL 25 MG PO TABS: 25.0000 mg | ORAL_TABLET | Freq: Four times a day (QID) | ORAL | Status: AC | PRN

## 2014-12-01 NOTE — MAU Note (Signed)
About 2030 started having some abd cramping and low back pain. Feels like when i start my period. Went to [facility] and saw dark blood on tissue with couple small bright red clots

## 2014-12-01 NOTE — MAU Provider Note (Signed)
Chief Complaint: Abdominal Pain and Vaginal Bleeding  First Provider Initiated Contact with Patient 12/01/14 0047     SUBJECTIVE HPI: Karla Stafford is a 24 y.o. G2P1001 at 730w4d by LMP who presents to [facility] reporting cramping radiating down bilateral thighs and around to low back since this morning and dark red spotting starting at ~2200. Past few small clots. Denies passage of tissue. Rates cramping 7/10 on pain scale. States it feels like menstrual cramps. Hasn't tried anything for the pain. There are no aggravating or alleviating factors. No recent sexual intercourse. Hasn't started prenatal care. [facility]. Has not had any other testing this pregnancy.  Past Medical History  Diagnosis Date  . Ovarian cyst   . Anemia    OB History  Gravida Para Term Preterm AB SAB TAB Ectopic Multiple Living  2 1 1       1     # Outcome Date GA Lbr Len/2nd Weight Sex Delivery Anes PTL Lv  2 Current           1 Term 08/11/10    Genella MechM Vag-Spont        Past Surgical History  Procedure Laterality Date  . Cholecystectomy     History   Social History  . Marital Status: Single    Spouse Name: N/A  . Number of Children: N/A  . Years of Education: N/A   Occupational History  . Not on file.   Social History Main Topics  . Smoking status: Light Tobacco Smoker    Types: Cigarettes  . Smokeless tobacco: Not on file  . Alcohol Use: No  . Drug Use: No  . Sexual Activity: Yes    Birth Control/ Protection:  none    Other Topics Concern  . Not on file   Social History Narrative   No current facility-administered medications on file prior to encounter.   Current Outpatient Prescriptions on File Prior to Encounter  Medication Sig Dispense Refill  . estrogens, conjugated, (PREMARIN) 1.25 MG tablet Take 2 tablets (2.5 mg total) by mouth 2 (two) times daily. 30 tablet 0  . etonogestrel-ethinyl estradiol (NUVARING) 0.12-0.015 MG/24HR vaginal ring Place  1 each vaginally every 28 (twenty-eight) days. Insert vaginally and leave in place for 3 consecutive weeks, then remove for 1 week.    Marland Kitchen. HYDROcodone-acetaminophen (NORCO/VICODIN) 5-325 MG per tablet Take 1 tablet by mouth every 4 (four) hours as needed for pain. 15 tablet 0  . ketorolac (TORADOL) 10 MG tablet Take 1 tablet (10 mg total) by mouth every 6 (six) hours as needed for pain. 20 tablet 0  . ondansetron (ZOFRAN ODT) 8 MG disintegrating tablet 8mg  ODT q4 hours prn nausea 12 tablet 0   Allergies  Allergen Reactions  . Clindamycin/Lincomycin Rash  . Doxycycline Rash  . Penicillins Rash    Review of Systems  Constitutional: Negative for fever and chills.  Gastrointestinal: Positive for abdominal pain. Negative for nausea, vomiting, diarrhea and constipation.  Genitourinary: Negative for dysuria, urgency, frequency, hematuria and flank pain.       Positive for vaginal bleeding. Negative for vaginal discharge.   OBJECTIVE Blood pressure 123/67, pulse 94, temperature 98.8 F (37.1 C), resp. rate 18, height 5\' 11"  (1.803 m), weight 208 lb 6.4 oz (94.53 kg), last menstrual period 10/16/2014. GENERAL: Well-developed, well-nourished female in no acute distress.  HEART: normal rate RESP: normal effort GI: Abdomen soft, non-tender. Positive bowel sounds 4. MS: Low back Non-tender. Normal range of  motion NEURO: Alert and oriented SPECULUM EXAM: NEFG, scant amount of dark red blood noted, cervix clean BIMANUAL: cervix closed; uterus normal size, no adnexal tenderness or masses. No cervical motion tenderness.  LAB RESULTS Results for orders placed or performed during the hospital encounter of 11/30/14 (from the past 24 hour(s))  Urinalysis, Routine w reflex microscopic     Status: Abnormal   Collection Time: 12/01/14 12:15 AM  Result Value Ref Range   Color, Urine YELLOW YELLOW   APPearance CLEAR CLEAR   Specific Gravity, Urine >1.030 (H) 1.005 - 1.030   pH 6.0 5.0 - 8.0   Glucose, UA  NEGATIVE NEGATIVE mg/dL   Hgb urine dipstick NEGATIVE NEGATIVE   Bilirubin Urine NEGATIVE NEGATIVE   Ketones, ur NEGATIVE NEGATIVE mg/dL   Protein, ur NEGATIVE NEGATIVE mg/dL   Urobilinogen, UA 0.2 0.0 - 1.0 mg/dL   Nitrite NEGATIVE NEGATIVE   Leukocytes, UA NEGATIVE NEGATIVE  Pregnancy, urine POC     Status: Abnormal   Collection Time: 12/01/14 12:23 AM  Result Value Ref Range   Preg Test, Ur POSITIVE (A) NEGATIVE  ABO/Rh     Status: None   Collection Time: 12/01/14 12:50 AM  Result Value Ref Range   ABO/RH(D) A POS   hCG, quantitative, pregnancy     Status: Abnormal   Collection Time: 12/01/14 12:50 AM  Result Value Ref Range   hCG, Beta Chain, Quant, S 1272 (H) <5 mIU/mL  CBC     Status: Abnormal   Collection Time: 12/01/14 12:50 AM  Result Value Ref Range   WBC 15.4 (H) 4.0 - 10.5 K/uL   RBC 4.45 3.87 - 5.11 MIL/uL   Hemoglobin 13.9 12.0 - 15.0 g/dL   HCT 16.1 09.6 - 04.5 %   MCV 91.2 78.0 - 100.0 fL   MCH 31.2 26.0 - 34.0 pg   MCHC 34.2 30.0 - 36.0 g/dL   RDW 40.9 81.1 - 91.4 %   Platelets 368 150 - 400 K/uL  Wet prep, genital     Status: Abnormal   Collection Time: 12/01/14  2:37 AM  Result Value Ref Range   Yeast Wet Prep HPF POC NONE SEEN NONE SEEN   Trich, Wet Prep NONE SEEN NONE SEEN   Clue Cells Wet Prep HPF POC FEW (A) NONE SEEN   WBC, Wet Prep HPF POC FEW (A) NONE SEEN    IMAGING US Ob Comp Less 14 Wks  12/01/2014   CLINICAL DATA:  Cramping and bleeding  EXAM: OBSTETRIC <14 WK [place] AND TRANSVAGINAL OB US  TECHNIQUE: Both transabdominal and transvaginal ultrasound examinations were performed for complete evaluation of the gestation as well as the maternal uterus, adnexal regions, and pelvic cul-de-sac. Transvaginal technique was performed to assess early pregnancy.  COMPARISON:  None.  FINDINGS: Intrauterine gestational sac: Visible  Yolk sac:  Visible  Embryo:  No  Cardiac Activity: No  Heart Rate: No  bpm  MSD: 6.5  Mm   5 w   to  d  Maternal uterus/adnexae:  Right ovarian corpus luteal cyst measuring less than 2 cm  IMPRESSION: Intrauterine gestational sac, too small to characterize. No fetal pole, or cardiac activity yet visualized. Recommend follow-up quantitative B-HCG levels and follow-up US in 14 days to confirm and assess viability. This recommendation follows SRU consensus guidelines: Diagnostic Criteria for Nonviable Pregnancy Early in the First Trimester. [name] 2013; 782:9562-13.   Electronically Signed   By: Ellery Plunk M.D.   On: 12/01/2014 01:54  US Ob Transvaginal  12/01/2014   CLINICAL DATA:  Cramping and bleeding  EXAM: OBSTETRIC <14 WK [place] AND TRANSVAGINAL OB US  TECHNIQUE: Both transabdominal and transvaginal ultrasound examinations were performed for complete evaluation of the gestation as well as the maternal uterus, adnexal regions, and pelvic cul-de-sac. Transvaginal technique was performed to assess early pregnancy.  COMPARISON:  None.  FINDINGS: Intrauterine gestational sac: Visible  Yolk sac:  Visible  Embryo:  No  Cardiac Activity: No  Heart Rate: No  bpm  MSD: 6.5  Mm   5 w   to  d  Maternal uterus/adnexae: Right ovarian corpus luteal cyst measuring less than 2 cm  IMPRESSION: Intrauterine gestational sac, too small to characterize. No fetal pole, or cardiac activity yet visualized. Recommend follow-up quantitative B-HCG levels and follow-up US in 14 days to confirm and assess viability. This recommendation follows SRU consensus guidelines: Diagnostic Criteria for Nonviable Pregnancy Early in the First Trimester. [name] 2013; 027:2536-64.   Electronically Signed   By: Ellery Plunk M.D.   On: 12/01/2014 01:54    MAU COURSE  ASSESSMENT 1. Threatened abortion in early pregnancy   2. Abdominal pain affecting pregnancy, antepartum   3. Vaginal bleeding in pregnancy, first trimester    PLAN Discharge home in stable condition. SAB and ectopic precautions. Pelvic rest 1 week. Bleeding precautions. Comfort  measures. Tylenol when necessary.     Follow-up Information    Follow up with [facility] [place] [facility] In 2 days.   Why:  for repeat bloodwork or sooner as needed if symptoms worsen.    Contact information:   [address] [id number] mc [place] [address] [phone]       Medication List    STOP taking these medications        estrogens (conjugated) 1.25 MG tablet  Commonly known as:  PREMARIN     etonogestrel-ethinyl estradiol 0.12-0.015 MG/24HR vaginal ring  Commonly known as:  NUVARING     HYDROcodone-acetaminophen 5-325 MG per tablet  Commonly known as:  NORCO/VICODIN     ketorolac 10 MG tablet  Commonly known as:  TORADOL      TAKE these medications        ondansetron 8 MG disintegrating tablet  Commonly known as:  ZOFRAN ODT   ODT q4 hours prn nausea     prenatal multivitamin Tabs tablet  Take 1 tablet by mouth daily at 12 noon.     promethazine 25 MG tablet  Commonly known as:  PHENERGAN  Take 1 tablet (25 mg total) by mouth every 6 (six) hours as needed.       [place], CNM 12/01/2014  2:47 AM

## 2014-12-01 NOTE — Discharge Instructions (Signed)
Your Hcg level was 1272 on 12/01/14.   Threatened Miscarriage A threatened miscarriage occurs when you have vaginal bleeding during your first 20 weeks of pregnancy but the pregnancy has not ended. If you have vaginal bleeding during this time, your health care provider will do tests to make sure you are still pregnant. If the tests show you are still pregnant and the developing baby (fetus) inside your womb (uterus) is still growing, your condition is considered a threatened miscarriage. A threatened miscarriage does not mean your pregnancy will end, but it does increase the risk of losing your pregnancy (complete miscarriage). CAUSES  The cause of a threatened miscarriage is usually not known. If you go on to have a complete miscarriage, the most common cause is an abnormal number of chromosomes in the developing baby. Chromosomes are the structures inside cells that hold all your genetic material. Some causes of vaginal bleeding that do not result in miscarriage include:  Having sex.  Having an infection.  Normal hormone changes of pregnancy.  Bleeding that occurs when an egg implants in your uterus. RISK FACTORS Risk factors for bleeding in early pregnancy include:  Obesity.  Smoking.  Drinking excessive amounts of alcohol or caffeine.  Recreational drug use. SIGNS AND SYMPTOMS  Light vaginal bleeding.  Mild abdominal pain or cramps. DIAGNOSIS  If you have bleeding with or without abdominal pain before 20 weeks of pregnancy, your health care provider will do tests to check whether you are still pregnant. One important test involves using sound waves and a computer (ultrasound) to create images of the inside of your uterus. Other tests include an internal exam of your vagina and uterus (pelvic exam) and measurement of your baby's heart rate.  You may be diagnosed with a threatened miscarriage if:  Ultrasound testing shows you are still pregnant.  Your baby's heart rate is  strong.  A pelvic exam shows that the opening between your uterus and your vagina (cervix) is closed.  Your heart rate and blood pressure are stable.  Blood tests confirm you are still pregnant. TREATMENT  No treatments have been shown to prevent a threatened miscarriage from going on to a complete miscarriage. However, the right home care is important.  HOME CARE INSTRUCTIONS   Make sure you keep all your appointments for prenatal care. This is very important.  Get plenty of rest.  Do not have sex or use tampons if you have vaginal bleeding.  Do not douche.  Do not smoke or use recreational drugs.  Do not drink alcohol.  Avoid caffeine. SEEK MEDICAL CARE IF:  You have light vaginal bleeding or spotting while pregnant.  You have abdominal pain or cramping.  You have a fever. SEEK IMMEDIATE MEDICAL CARE IF:  You have heavy vaginal bleeding.  You have blood clots coming from your vagina.  You have severe low back pain or abdominal cramps.  You have fever, chills, and severe abdominal pain. MAKE SURE YOU:  Understand these instructions.  Will watch your condition.  Will get help right away if you are not doing well or get worse. Document Released: 08/16/2005 Document Revised: 08/21/2013 Document Reviewed: 06/12/2013 [facility] Patient Information 2015 [place], [place]. This information is not intended to replace advice given to you by your health care provider. Make sure you discuss any questions you have with your health care provider.

## 2014-12-02 ENCOUNTER — Other Ambulatory Visit: Payer: Self-pay | Admitting: Obstetrics

## 2014-12-02 DIAGNOSIS — O98811 Other maternal infectious and parasitic diseases complicating pregnancy, first trimester: Principal | ICD-10-CM

## 2014-12-02 DIAGNOSIS — A749 Chlamydial infection, unspecified: Secondary | ICD-10-CM

## 2014-12-02 LAB — GC/CHLAMYDIA PROBE AMP (~~LOC~~) NOT AT ARMC
CHLAMYDIA, DNA PROBE: POSITIVE — AB
NEISSERIA GONORRHEA: NEGATIVE

## 2014-12-02 MED ORDER — AZITHROMYCIN 250 MG PO TABS: 1000.0000 mg | ORAL_TABLET | Freq: Once | ORAL | Status: DC

## 2014-12-02 MED ORDER — CEFIXIME 400 MG PO TABS: 400.0000 mg | ORAL_TABLET | Freq: Every day | ORAL | Status: DC

## 2014-12-03 ENCOUNTER — Inpatient Hospital Stay (HOSPITAL_COMMUNITY)
Admission: AD | Admit: 2014-12-03 | Discharge: 2014-12-03 | Disposition: A | Discharge status: Home / Self Care | Payer: Medicaid Other | Source: Ambulatory Visit | Attending: Obstetrics | Admitting: Obstetrics

## 2014-12-03 DIAGNOSIS — Z3A01 Less than 8 weeks gestation of pregnancy: Secondary | ICD-10-CM | POA: Insufficient documentation

## 2014-12-03 DIAGNOSIS — O9989 Other specified diseases and conditions complicating pregnancy, childbirth and the puerperium: Secondary | ICD-10-CM | POA: Diagnosis present

## 2014-12-03 DIAGNOSIS — O209 Hemorrhage in early pregnancy, unspecified: Secondary | ICD-10-CM | POA: Insufficient documentation

## 2014-12-03 DIAGNOSIS — O99331 Smoking (tobacco) complicating pregnancy, first trimester: Secondary | ICD-10-CM | POA: Diagnosis not present

## 2014-12-03 DIAGNOSIS — F17210 Nicotine dependence, cigarettes, uncomplicated: Secondary | ICD-10-CM | POA: Insufficient documentation

## 2014-12-03 LAB — POCT PREGNANCY, URINE: Preg Test, Ur: POSITIVE — AB

## 2014-12-03 LAB — URINALYSIS, ROUTINE W REFLEX MICROSCOPIC
BILIRUBIN URINE: NEGATIVE
Glucose, UA: NEGATIVE mg/dL
Ketones, ur: NEGATIVE mg/dL
LEUKOCYTES UA: NEGATIVE
NITRITE: NEGATIVE
PH: 5 (ref 5.0–8.0)
Protein, ur: NEGATIVE mg/dL
SPECIFIC GRAVITY, URINE: 1.025 (ref 1.005–1.030)
Urobilinogen, UA: 0.2 mg/dL (ref 0.0–1.0)

## 2014-12-03 LAB — URINE MICROSCOPIC-ADD ON

## 2014-12-03 LAB — HCG, QUANTITATIVE, PREGNANCY: hCG, Beta Chain, Quant, S: 1541 m[IU]/mL — ABNORMAL HIGH (ref <5)

## 2014-12-03 NOTE — MAU Provider Note (Signed)
Chief Complaint: Follow-up   None    SUBJECTIVE HPI: Karla Stafford is a 24 y.o. G2P1001 at 6729w6d by LMP who presents for follow-up beta hCG. Had light spotting 2 d ago, now resolved. No pelvic or abdominal pain. IUP documented (YS seen).   Past Medical History  Diagnosis Date  . Ovarian cyst   . Anemia    OB History  Gravida Para Term Preterm AB SAB TAB Ectopic Multiple Living  2 1 1       1     # Outcome Date GA Lbr Len/2nd Weight Sex Delivery Anes PTL Lv  2 Current           1 Term 08/11/10    [name] Vag-Spont        Past Surgical History  Procedure Laterality Date  . Cholecystectomy     History   Social History  . Marital Status: Single    Spouse Name: N/A  . Number of Children: N/A  . Years of Education: N/A   Occupational History  . Not on file.   Social History Main Topics  . Smoking status: Light Tobacco Smoker    Types: Cigarettes  . Smokeless tobacco: Not on file  . Alcohol Use: No  . Drug Use: No  . Sexual Activity: Yes    Birth Control/ Protection: Inserts   Other Topics Concern  . Not on file   Social History Narrative   No current facility-administered medications on file prior to encounter.   Current Outpatient Prescriptions on File Prior to Encounter  Medication Sig Dispense Refill  . ondansetron (ZOFRAN ODT) 8 MG disintegrating tablet 8mg  ODT q4 hours prn nausea 12 tablet 0  . Prenatal Vit-Fe Fumarate-FA (PRENATAL MULTIVITAMIN) TABS tablet Take 1 tablet by mouth daily at 12 noon.    . promethazine (PHENERGAN) 25 MG tablet Take 1 tablet (25 mg total) by mouth every 6 (six) hours as needed. 30 tablet 1   Allergies  Allergen Reactions  . Clindamycin/Lincomycin Rash  . Doxycycline Rash  . Penicillins Rash    ROS  OBJECTIVE Blood pressure 117/73, pulse 82, temperature 98.3 F (36.8 C), temperature source Oral, resp. rate 18, last menstrual period 10/16/2014. GENERAL: Well-developed, well-nourished female in no acute distress.  HEART: normal  rate RESP: normal effort GI: Abdomen soft, non-tender. Positive bowel sounds 4. MS: Nontender, no edema NEURO: Alert and oriented  LAB RESULTS Results for orders placed or performed during the hospital encounter of 12/03/14 (from the past 24 hour(s))  hCG, quantitative, pregnancy     Status: Abnormal   Collection Time: 12/03/14  3:02 PM  Result Value Ref Range   hCG, Beta Chain, Quant, S 1541 (H) <5 mIU/mL  Pregnancy, urine POC     Status: Abnormal   Collection Time: 12/03/14  3:40 PM  Result Value Ref Range   Preg Test, Ur POSITIVE (A) NEGATIVE     Ref Range 3:02 PM  2d ago     hCG, Beta Chain, Quant, S <5 mIU/mL 1541 (H) 1272 (H)CM        IMAGING [place] Ob Comp Less 14 Wks  12/01/2014   CLINICAL DATA:  Cramping and bleeding  EXAM: OBSTETRIC <14 WK US AND TRANSVAGINAL OB US  TECHNIQUE: Both transabdominal and transvaginal ultrasound examinations were performed for complete evaluation of the gestation as well as the maternal uterus, adnexal regions, and pelvic cul-de-sac. Transvaginal technique was performed to assess early pregnancy.  COMPARISON:  None.  FINDINGS: Intrauterine gestational sac: Visible  Yolk sac:  Visible  Embryo:  No  Cardiac Activity: No  Heart Rate: No  bpm  MSD: 6.5  Mm   5 w   to  d  Maternal uterus/adnexae: Right ovarian corpus luteal cyst measuring less than 2 cm  IMPRESSION: Intrauterine gestational sac, too small to characterize. No fetal pole, or cardiac activity yet visualized. Recommend follow-up quantitative B-HCG levels and follow-up US in 14 days to confirm and assess viability. This recommendation follows SRU consensus guidelines: Diagnostic Criteria for Nonviable Pregnancy Early in the First Trimester. [name] 2013; 119:1478-29.   Electronically Signed   By: Ellery Plunk M.D.   On: 12/01/2014 01:54   US Ob Transvaginal  12/01/2014   CLINICAL DATA:  Cramping and bleeding  EXAM: OBSTETRIC <14 WK [place] AND TRANSVAGINAL OB US  TECHNIQUE: Both  transabdominal and transvaginal ultrasound examinations were performed for complete evaluation of the gestation as well as the maternal uterus, adnexal regions, and pelvic cul-de-sac. Transvaginal technique was performed to assess early pregnancy.  COMPARISON:  None.  FINDINGS: Intrauterine gestational sac: Visible  Yolk sac:  Visible  Embryo:  No  Cardiac Activity: No  Heart Rate: No  bpm  MSD: 6.5  Mm   5 w   to  d  Maternal uterus/adnexae: Right ovarian corpus luteal cyst measuring less than 2 cm  IMPRESSION: Intrauterine gestational sac, too small to characterize. No fetal pole, or cardiac activity yet visualized. Recommend follow-up quantitative B-HCG levels and follow-up US in 14 days to confirm and assess viability. This recommendation follows SRU consensus guidelines: Diagnostic Criteria for Nonviable Pregnancy Early in the First Trimester. [name] 2013; 562:1308-65.   Electronically Signed   By: Ellery Plunk M.D.   On: 12/01/2014 01:54    MAU COURSE  ASSESSMENT 1. Bleeding in early pregnancy   IUP   PLAN Discharge home in stable condition. Pregnancy precautions given.  Explained reasons first tri bleeding    Medication List    STOP taking these medications        azithromycin 250 MG tablet  Commonly known as:  ZITHROMAX     cefixime 400 MG tablet  Commonly known as:  SUPRAX     ondansetron 8 MG disintegrating tablet  Commonly known as:  ZOFRAN ODT      TAKE these medications        prenatal multivitamin Tabs tablet  Take 1 tablet by mouth daily at 12 noon.     promethazine 25 MG tablet  Commonly known as:  PHENERGAN  Take 1 tablet (25 mg total) by mouth every 6 (six) hours as needed.       Follow-up Information    Follow up with Nursepractioner Mau, NP In 1 week.   Why:  Someone from [place] will call you with appt.    [place] in 1 week  [name], CNM 12/03/2014  4:45 PM

## 2014-12-03 NOTE — MAU Note (Signed)
Pt here for F/U BHCG.  Pt states she has  dark brown spotting, continues to have lower abd cramping - was worse yesterday.

## 2014-12-03 NOTE — MAU Note (Signed)
Pt states for the past several days she feels like she has to urinate frequently but doesn't go very much, states she has been hydrating well.

## 2014-12-03 NOTE — Discharge Instructions (Signed)
Vaginal Bleeding During Pregnancy, First Trimester  A small amount of bleeding (spotting) from the vagina is relatively common in early pregnancy. It usually stops on its own. Various things may cause bleeding or spotting in early pregnancy. Some bleeding may be related to the pregnancy, and some may not. In most cases, the bleeding is normal and is not a problem. However, bleeding can also be a sign of something serious. Be sure to tell your health care provider about any vaginal bleeding right away.  Some possible causes of vaginal bleeding during the first trimester include:  · Infection or inflammation of the cervix.  · Growths (polyps) on the cervix.  · Miscarriage or threatened miscarriage.  · Pregnancy tissue has developed outside of the uterus and in a fallopian tube (tubal pregnancy).  · Tiny cysts have developed in the uterus instead of pregnancy tissue (molar pregnancy).  HOME CARE INSTRUCTIONS   Watch your condition for any changes. The following actions may help to lessen any discomfort you are feeling:  · Follow your health care provider's instructions for limiting your activity. If your health care provider orders bed rest, you may need to stay in bed and only get up to use the bathroom. However, your health care provider may allow you to continue light activity.  · If needed, make plans for someone to help with your regular activities and responsibilities while you are on bed rest.  · Keep track of the number of pads you use each day, how often you change pads, and how soaked (saturated) they are. Write this down.  · Do not use tampons. Do not douche.  · Do not have sexual intercourse or orgasms until approved by your health care provider.  · If you pass any tissue from your vagina, save the tissue so you can show it to your health care provider.  · Only take over-the-counter or prescription medicines as directed by your health care provider.  · Do not take aspirin because it can make you  bleed.  · Keep all follow-up appointments as directed by your health care provider.  SEEK MEDICAL CARE IF:  · You have any vaginal bleeding during any part of your pregnancy.  · You have cramps or labor pains.  · You have a fever, not controlled by medicine.  SEEK IMMEDIATE MEDICAL CARE IF:   · You have severe cramps in your back or belly (abdomen).  · You pass large clots or tissue from your vagina.  · Your bleeding increases.  · You feel light-headed or weak, or you have fainting episodes.  · You have chills.  · You are leaking fluid or have a gush of fluid from your vagina.  · You pass out while having a bowel movement.  MAKE SURE YOU:  · Understand these instructions.  · Will watch your condition.  · Will get help right away if you are not doing well or get worse.  Document Released: 05/26/2005 Document Revised: 08/21/2013 Document Reviewed: 04/23/2013  ExitCare® Patient Information ©2015 ExitCare, LLC. This information is not intended to replace advice given to you by your health care provider. Make sure you discuss any questions you have with your health care provider.

## 2014-12-04 ENCOUNTER — Encounter: Payer: Self-pay | Admitting: Advanced Practice Midwife

## 2014-12-04 DIAGNOSIS — A749 Chlamydial infection, unspecified: Secondary | ICD-10-CM | POA: Insufficient documentation

## 2014-12-04 DIAGNOSIS — O98811 Other maternal infectious and parasitic diseases complicating pregnancy, first trimester: Secondary | ICD-10-CM

## 2014-12-08 ENCOUNTER — Encounter (HOSPITAL_COMMUNITY): Payer: Self-pay | Admitting: *Deleted

## 2014-12-08 ENCOUNTER — Inpatient Hospital Stay (HOSPITAL_COMMUNITY)
Admission: AD | Admit: 2014-12-08 | Discharge: 2014-12-08 | Disposition: A | Discharge status: Home / Self Care | Payer: Medicaid Other | Source: Ambulatory Visit | Attending: Obstetrics | Admitting: Obstetrics

## 2014-12-08 ENCOUNTER — Inpatient Hospital Stay (HOSPITAL_COMMUNITY): Payer: Medicaid Other

## 2014-12-08 DIAGNOSIS — F17210 Nicotine dependence, cigarettes, uncomplicated: Secondary | ICD-10-CM | POA: Diagnosis not present

## 2014-12-08 DIAGNOSIS — O99331 Smoking (tobacco) complicating pregnancy, first trimester: Secondary | ICD-10-CM | POA: Insufficient documentation

## 2014-12-08 DIAGNOSIS — O034 Incomplete spontaneous abortion without complication: Secondary | ICD-10-CM

## 2014-12-08 DIAGNOSIS — Z3A01 Less than 8 weeks gestation of pregnancy: Secondary | ICD-10-CM | POA: Insufficient documentation

## 2014-12-08 DIAGNOSIS — O209 Hemorrhage in early pregnancy, unspecified: Secondary | ICD-10-CM

## 2014-12-08 LAB — CBC
HCT: 40.5 % (ref 36.0–46.0)
Hemoglobin: 13.7 g/dL (ref 12.0–15.0)
MCH: 31.1 pg (ref 26.0–34.0)
MCHC: 33.8 g/dL (ref 30.0–36.0)
MCV: 92 fL (ref 78.0–100.0)
PLATELETS: 355 10*3/uL (ref 150–400)
RBC: 4.4 MIL/uL (ref 3.87–5.11)
RDW: 12.8 % (ref 11.5–15.5)
WBC: 15.8 10*3/uL — AB (ref 4.0–10.5)

## 2014-12-08 LAB — URINALYSIS, ROUTINE W REFLEX MICROSCOPIC
Bilirubin Urine: NEGATIVE
Glucose, UA: NEGATIVE mg/dL
Ketones, ur: 15 mg/dL — AB
Leukocytes, UA: NEGATIVE
NITRITE: NEGATIVE
PH: 5.5 (ref 5.0–8.0)
PROTEIN: NEGATIVE mg/dL
UROBILINOGEN UA: 0.2 mg/dL (ref 0.0–1.0)

## 2014-12-08 LAB — URINE MICROSCOPIC-ADD ON

## 2014-12-08 LAB — HCG, QUANTITATIVE, PREGNANCY: hCG, Beta Chain, Quant, S: 1140 m[IU]/mL — ABNORMAL HIGH (ref <5)

## 2014-12-08 MED ORDER — OXYCODONE-ACETAMINOPHEN 5-325 MG PO TABS: 1.0000 | ORAL_TABLET | ORAL | Status: DC | PRN

## 2014-12-08 MED ORDER — IBUPROFEN 800 MG PO TABS
800.0000 mg | ORAL_TABLET | Freq: Three times a day (TID) | ORAL | Status: AC | PRN
Start: 2014-12-08 — End: ?

## 2014-12-08 MED ORDER — IBUPROFEN 800 MG PO TABS
800.0000 mg | ORAL_TABLET | Freq: Once | ORAL | Status: AC
  Administered 2014-12-08: 800 mg via ORAL
  Filled 2014-12-08: qty 1

## 2014-12-08 NOTE — Progress Notes (Signed)
Written and verbal d/c instructions given by Georges MouseNatalie Frazier CNM and pt d/c to home before signing.

## 2014-12-08 NOTE — MAU Note (Signed)
All Friday night and Sat having more vag bleeding and worse cramping. Some clots. ? Passed some tissue which i brought with me. Pelvic pressure

## 2014-12-08 NOTE — MAU Provider Note (Signed)
History     CSN: [id number]  Arrival date and time: 12/08/14 4098   None     Chief Complaint  Patient presents with  . Vaginal Bleeding  . Abdominal Cramping   HPI 24 y.o. G2P1001 at [redacted]w[redacted]d w/ vaginal bleeding, increased over the past day with increased cramping as well. Pt was seen in MAU on 4/3 for same complaint, u/s showed 5 w 5 d IUGS, + YS, no fetal pole, quant 1200 at that time, f/u on 4/5 w/ quant of 1500, follow up u/s was planned for 1 week from that date. Blood type is A pos.   Past Medical History  Diagnosis Date  . Ovarian cyst   . Anemia     Past Surgical History  Procedure Laterality Date  . Cholecystectomy      Family History  Problem Relation Age of Onset  . Diabetes Maternal Grandmother   . Hypertension Maternal Grandmother   . Autoimmune disease Maternal Grandmother   . Autoimmune disease      History  Substance Use Topics  . Smoking status: Light Tobacco Smoker    Types: Cigarettes  . Smokeless tobacco: Not on file  . Alcohol Use: No    Allergies:  Allergies  Allergen Reactions  . Clindamycin/Lincomycin Rash  . Doxycycline Rash  . Penicillins Rash    Prescriptions prior to admission  Medication Sig Dispense Refill Last Dose  . acetaminophen (TYLENOL) 500 MG tablet Take 1,000 mg by mouth every 6 (six) hours as needed.   12/07/2014 at Unknown time  . Prenatal Vit-Fe Fumarate-FA (PRENATAL MULTIVITAMIN) TABS tablet Take 1 tablet by mouth daily at 12 noon.   12/07/2014 at Unknown time  . promethazine (PHENERGAN) 25 MG tablet Take 1 tablet (25 mg total) by mouth every 6 (six) hours as needed. 30 tablet 1 Past Week at Unknown time    Review of Systems  Constitutional: Negative.   Respiratory: Negative.   Cardiovascular: Negative.   Gastrointestinal: Negative for nausea, vomiting, abdominal pain, diarrhea and constipation.  Genitourinary: Negative for dysuria, urgency, frequency, hematuria and flank pain.       + bleeding and cramping    Musculoskeletal: Negative.   Neurological: Negative.   Psychiatric/Behavioral: Negative.    Physical Exam   Blood pressure 113/71, pulse 86, temperature 98.3 F (36.8 C), temperature source Oral, resp. rate 18, height 5\' 11"  (1.803 m), weight 209 lb (94.802 kg), last menstrual period 10/16/2014.  Physical Exam  Constitutional: She is oriented to person, place, and time. She appears well-developed and well-nourished. No distress.  HENT:  Head: Normocephalic and atraumatic.  Cardiovascular: Normal rate.   Respiratory: Effort normal. No respiratory distress.  GI: Soft. Bowel sounds are normal. She exhibits no distension and no mass. There is no tenderness. There is no rebound and no guarding.  Genitourinary: There is no rash or lesion on the right labia. There is no rash or lesion on the left labia. Uterus is not deviated, not enlarged, not fixed and not tender. Cervix exhibits no motion tenderness, no discharge and no friability. Right adnexum displays no mass, no tenderness and no fullness. Left adnexum displays no mass, no tenderness and no fullness. There is bleeding (light) in the vagina. No erythema or tenderness in the vagina. No vaginal discharge found.  Cervix closed   Neurological: She is alert and oriented to person, place, and time.  Skin: Skin is warm and dry.  Psychiatric: She has a normal mood and affect.    MAU Course  Procedures  Results for orders placed or performed during the hospital encounter of 12/08/14 (from the past 24 hour(s))  Urinalysis, Routine w reflex microscopic     Status: Abnormal   Collection Time: 12/08/14  2:15 AM  Result Value Ref Range   Color, Urine YELLOW YELLOW   APPearance CLEAR CLEAR   Specific Gravity, Urine >1.030 (H) 1.005 - 1.030   pH 5.5 5.0 - 8.0   Glucose, UA NEGATIVE NEGATIVE mg/dL   Hgb urine dipstick MODERATE (A) NEGATIVE   Bilirubin Urine NEGATIVE NEGATIVE   Ketones, ur 15 (A) NEGATIVE mg/dL   Protein, ur NEGATIVE NEGATIVE  mg/dL   Urobilinogen, UA 0.2 0.0 - 1.0 mg/dL   Nitrite NEGATIVE NEGATIVE   Leukocytes, UA NEGATIVE NEGATIVE  Urine microscopic-add on     Status: Abnormal   Collection Time: 12/08/14  2:15 AM  Result Value Ref Range   Squamous Epithelial / LPF RARE RARE   WBC, UA 0-2 <3 WBC/hpf   RBC / HPF 0-2 <3 RBC/hpf   Bacteria, UA RARE RARE   Crystals CA OXALATE CRYSTALS (A) NEGATIVE   Urine-Other MUCOUS PRESENT   CBC     Status: Abnormal   Collection Time: 12/08/14  2:40 AM  Result Value Ref Range   WBC 15.8 (H) 4.0 - 10.5 K/uL   RBC 4.40 3.87 - 5.11 MIL/uL   Hemoglobin 13.7 12.0 - 15.0 g/dL   HCT 16.1 09.6 - 04.5 %   MCV 92.0 78.0 - 100.0 fL   MCH 31.1 26.0 - 34.0 pg   MCHC 33.8 30.0 - 36.0 g/dL   RDW 40.9 81.1 - 91.4 %   Platelets 355 150 - 400 K/uL  hCG, quantitative, pregnancy     Status: Abnormal   Collection Time: 12/08/14  2:40 AM  Result Value Ref Range   hCG, Beta Chain, Quant, S 1140 (H) <5 mIU/mL   US Ob Comp Less 14 Wks  12/01/2014   CLINICAL DATA:  Cramping and bleeding  EXAM: OBSTETRIC <14 WK [place] AND TRANSVAGINAL OB US  TECHNIQUE: Both transabdominal and transvaginal ultrasound examinations were performed for complete evaluation of the gestation as well as the maternal uterus, adnexal regions, and pelvic cul-de-sac. Transvaginal technique was performed to assess early pregnancy.  COMPARISON:  None.  FINDINGS: Intrauterine gestational sac: Visible  Yolk sac:  Visible  Embryo:  No  Cardiac Activity: No  Heart Rate: No  bpm  MSD: 6.5  Mm   5 w   to  d  Maternal uterus/adnexae: Right ovarian corpus luteal cyst measuring less than 2 cm  IMPRESSION: Intrauterine gestational sac, too small to characterize. No fetal pole, or cardiac activity yet visualized. Recommend follow-up quantitative B-HCG levels and follow-up US in 14 days to confirm and assess viability. This recommendation follows SRU consensus guidelines: Diagnostic Criteria for Nonviable Pregnancy Early in the First Trimester. [name] 2013; 782:9562-13.   Electronically Signed   By: Ellery Plunk M.D.   On: 12/01/2014 01:54   US Ob Transvaginal  12/08/2014   CLINICAL DATA:  Heavy bleeding and pain. Follow-up viability. Quantitative beta HCG is pending.  EXAM: TRANSVAGINAL OB ULTRASOUND  TECHNIQUE: Transvaginal ultrasound was performed for complete evaluation of the gestation as well as the maternal uterus, adnexal regions, and pelvic cul-de-sac.  COMPARISON:  Reported during downtime. No prior comparison available.  FINDINGS: Intrauterine gestational sac: A single intrauterine gestational sac is visualized in the lower uterine segment. Sac appears to be collapsed.  Yolk sac:  Not identified.  Embryo:  Not identified.  Cardiac Activity: Not identified.  MSD: 4  mm   4 w   6  d                 [place] EDC: 08/11/2015  Maternal uterus/adnexae: No subchorionic hemorrhage demonstrated. Both ovaries are visualized and appear normal. No free pelvic fluid collections.  IMPRESSION: A single intrauterine gestational sac is visualized in the lower uterine segment appears collapsed. Fetal pole and yolk sac are not identified. Prior studies are not available for comparison, but report of previous study from 12/01/2014 indicates decreasing size of gestational sac. This is suspicious for intrauterine fetal demise or blighted ovum.  This exam was interpreted during a PACS downtime with limited availability of comparison cases. It has been flagged for review following the downtime. If clinically indicated after this review, an addendum will be issued providing details about comparison to prior imaging.   Electronically Signed   By: Burman Nieves M.D.   On: 12/08/2014 03:47   US Ob Transvaginal  12/01/2014   CLINICAL DATA:  Cramping and bleeding  EXAM: OBSTETRIC <14 WK [place] AND TRANSVAGINAL OB US  TECHNIQUE: Both transabdominal and transvaginal ultrasound examinations were performed for complete evaluation of the gestation as well as the maternal  uterus, adnexal regions, and pelvic cul-de-sac. Transvaginal technique was performed to assess early pregnancy.  COMPARISON:  None.  FINDINGS: Intrauterine gestational sac: Visible  Yolk sac:  Visible  Embryo:  No  Cardiac Activity: No  Heart Rate: No  bpm  MSD: 6.5  Mm   5 w   to  d  Maternal uterus/adnexae: Right ovarian corpus luteal cyst measuring less than 2 cm  IMPRESSION: Intrauterine gestational sac, too small to characterize. No fetal pole, or cardiac activity yet visualized. Recommend follow-up quantitative B-HCG levels and follow-up US in 14 days to confirm and assess viability. This recommendation follows SRU consensus guidelines: Diagnostic Criteria for Nonviable Pregnancy Early in the First Trimester. [name] 2013; 161:0960-45.   Electronically Signed   By: Ellery Plunk M.D.   On: 12/01/2014 01:54     Assessment and Plan   1. Incomplete miscarriage   2. Vaginal bleeding in pregnancy, first trimester   3. Vaginal bleeding in pregnancy, first trimester   Decreased quant from prior visit, sac decreased in size and now in LUS, apparent SAB in progress, bleeding and pain stable, rev'd precautions, call Dr. Verdell Carmine office for follow up this week, return to MAU w/ excessive bleeding or uncontrolled pain    Medication List    STOP taking these medications        acetaminophen 500 MG tablet  Commonly known as:  TYLENOL      TAKE these medications        ibuprofen 800 MG tablet  Commonly known as:  ADVIL,MOTRIN  Take 1 tablet (800 mg total) by mouth every 8 (eight) hours as needed.     oxyCODONE-acetaminophen 5-325 MG per tablet  Commonly known as:  PERCOCET/ROXICET  Take 1 tablet by mouth every 4 (four) hours as needed for severe pain.     prenatal multivitamin Tabs tablet  Take 1 tablet by mouth daily at 12 noon.     promethazine 25 MG tablet  Commonly known as:  PHENERGAN  Take 1 tablet (25 mg total) by mouth every 6 (six) hours as needed.             Follow-up Information  Follow up with Brock BadHARPER,CHARLES A, MD.   Specialty:  Obstetrics and Gynecology   Contact information:   [address] [place] [address] [phone]         [facility]RAZIER,Nayomi Tabron 12/08/2014, 4:38 AM

## 2014-12-08 NOTE — Discharge Instructions (Signed)
Miscarriage A miscarriage is the sudden loss of an unborn baby (fetus) before the 20th week of pregnancy. Most miscarriages happen in the first 3 months of pregnancy. Sometimes, it happens before a woman even knows she is pregnant. A miscarriage is also called a "spontaneous miscarriage" or "early pregnancy loss." Having a miscarriage can be an emotional experience. Talk with your caregiver about any questions you may have about miscarrying, the grieving process, and your future pregnancy plans. CAUSES   Problems with the fetal chromosomes that make it impossible for the baby to develop normally. Problems with the baby's genes or chromosomes are most often the result of errors that occur, by chance, as the embryo divides and grows. The problems are not inherited from the parents.  Infection of the cervix or uterus.   Hormone problems.   Problems with the cervix, such as having an incompetent cervix. This is when the tissue in the cervix is not strong enough to hold the pregnancy.   Problems with the uterus, such as an abnormally shaped uterus, uterine fibroids, or congenital abnormalities.   Certain medical conditions.   Smoking, drinking alcohol, or taking illegal drugs.   Trauma.  Often, the cause of a miscarriage is unknown.  SYMPTOMS   Vaginal bleeding or spotting, with or without cramps or pain.  Pain or cramping in the abdomen or lower back.  Passing fluid, tissue, or blood clots from the vagina. DIAGNOSIS  Your caregiver will perform a physical exam. You may also have an ultrasound to confirm the miscarriage. Blood or urine tests may also be ordered. TREATMENT   Sometimes, treatment is not necessary if you naturally pass all the fetal tissue that was in the uterus. If some of the fetus or placenta remains in the body (incomplete miscarriage), tissue left behind may become infected and must be removed. Usually, a dilation and curettage (D and C) procedure is performed.  During a D and C procedure, the cervix is widened (dilated) and any remaining fetal or placental tissue is gently removed from the uterus.  Antibiotic medicines are prescribed if there is an infection. Other medicines may be given to reduce the size of the uterus (contract) if there is a lot of bleeding.  If you have Rh negative blood and your baby was Rh positive, you will need a Rh immunoglobulin shot. This shot will protect any future baby from having Rh blood problems in future pregnancies. HOME CARE INSTRUCTIONS   Your caregiver may order bed rest or may allow you to continue light activity. Resume activity as directed by your caregiver.  Have someone help with home and family responsibilities during this time.   Keep track of the number of sanitary pads you use each day and how soaked (saturated) they are. Write down this information.   Do not use tampons. Do not douche or have sexual intercourse until approved by your caregiver.   Only take over-the-counter or prescription medicines for pain or discomfort as directed by your caregiver.   Do not take aspirin. Aspirin can cause bleeding.   Keep all follow-up appointments with your caregiver.   If you or your partner have problems with grieving, talk to your caregiver or seek counseling to help cope with the pregnancy loss. Allow enough time to grieve before trying to get pregnant again.  SEEK IMMEDIATE MEDICAL CARE IF:   You have severe cramps or pain in your back or abdomen.  You have a fever.  You pass large blood clots (walnut-sized   or larger) ortissue from your vagina. Save any tissue for your caregiver to inspect.   Your bleeding increases.   You have a thick, bad-smelling vaginal discharge.  You become lightheaded, weak, or you faint.   You have chills.  MAKE SURE YOU:  Understand these instructions.  Will watch your condition.  Will get help right away if you are not doing well or get  worse. Document Released: 02/09/2001 Document Revised: 12/11/2012 Document Reviewed: 10/05/2011 ExitCare Patient Information 2015 [employer]. This information is not intended to replace advice given to you by your health care provider. Make sure you discuss any questions you have with your health care provider.  

## 2014-12-08 NOTE — MAU Note (Signed)
?   Piece of tissue tiny piece of clot

## 2014-12-09 ENCOUNTER — Telehealth: Payer: Self-pay | Admitting: *Deleted

## 2014-12-11 ENCOUNTER — Inpatient Hospital Stay (HOSPITAL_COMMUNITY)
Admission: EM | Admit: 2014-12-11 | Discharge: 2014-12-11 | Disposition: A | Discharge status: Home / Self Care | Payer: Medicaid Other | Source: Ambulatory Visit | Attending: Obstetrics & Gynecology | Admitting: Obstetrics & Gynecology

## 2014-12-11 ENCOUNTER — Ambulatory Visit: Payer: Medicaid Other | Admitting: Certified Nurse Midwife

## 2014-12-11 DIAGNOSIS — O039 Complete or unspecified spontaneous abortion without complication: Secondary | ICD-10-CM | POA: Diagnosis not present

## 2014-12-11 LAB — HCG, QUANTITATIVE, PREGNANCY: hCG, Beta Chain, Quant, S: 883 m[IU]/mL — ABNORMAL HIGH (ref <5)

## 2014-12-11 MED ORDER — OXYCODONE-ACETAMINOPHEN 5-325 MG PO TABS: 1.0000 | ORAL_TABLET | ORAL | Status: AC | PRN

## 2014-12-11 NOTE — MAU Provider Note (Signed)
Subjective:  [name] is a 24 y.o. female G2P1001 at 5938w0d who presents to MAU for a follow up. She was diagnosed with an impending SAB the last time she was seen in MAU and was told to follow up with Dr. Clearance CootsHarper. She went to Dr. Verdell CarmineHarper's office today and was told that they would not see her for this problem because she has not been established as their patient with this pregnancy. She was told to come here.  She continues to have bleeding, however feels that it is much lighter. She continues to have pain and has been taking ibuprofen and percocet as needed with some relief. She is very upset and feels that she has not been informed about where and what to do from here.   Objective:  GENERAL: Well-developed, well-nourished female in no acute distress.  LUNGS: Effort normal SKIN: Warm, dry and without erythema PSYCH: Normal mood and affect  Filed Vitals:   12/11/14 1159  BP: 125/79  Pulse: 100  Temp: 98.6 F (37 C)  TempSrc: Oral  Resp: 20  SpO2: 100%   Results for orders placed or performed during the hospital encounter of 12/11/14 (from the past 48 hour(s))  hCG, quantitative, pregnancy     Status: Abnormal   Collection Time: 12/11/14 12:30 PM  Result Value Ref Range   hCG, Beta Chain, Quant, S 883 (H) <5 mIU/mL    Comment:          GEST. AGE      CONC.  (mIU/mL)   <=1 WEEK        5 - 50     2 WEEKS       50 - 500     3 WEEKS       100 - 10,000     4 WEEKS     1,000 - 30,000     5 WEEKS     3,500 - 115,000   6-8 WEEKS     12,000 - 270,000    12 WEEKS     15,000 - 220,000        FEMALE AND NON-PREGNANT FEMALE:     LESS THAN 5 mIU/mL      Assessment:  1. SAB (spontaneous abortion)   2.     Quant has dropped significantly    Plan:  Discharge home in stable condition Follow up in [facility] in 1-2 weeks for beta hcg level; message sent  Bleeding precautions Return to MAU if symptoms worsen.  RX percocet #10 no refills  Support given   Duane LopeJennifer I Dominyck Reser,  NP 12/11/2014 1:23 PM

## 2014-12-11 NOTE — MAU Note (Signed)
Pt has been seen in MAU for miscarriage, was to F/U with Dr. Clearance CootsHarper.  Was told by MD office that she had not been seen there before & would need to F/U in MAU.  Having lower abd & back pain, also light bleeding.

## 2014-12-11 NOTE — MAU Note (Signed)
Urine in lab 

## 2014-12-11 NOTE — Discharge Instructions (Signed)
Miscarriage °A miscarriage is the loss of an unborn baby (fetus) before the 20th week of pregnancy. The cause is often unknown.  °HOME CARE °· You may need to stay in bed (bed rest), or you may be able to do light activity. Go about activity as told by your doctor. °· Have help at home. °· Write down how many pads you use each day. Write down how soaked they are. °· Do not use tampons. Do not wash out your vagina (douche) or have sex (intercourse) until your doctor approves. °· Only take medicine as told by your doctor. °· Do not take aspirin. °· Keep all doctor visits as told. °· If you or your partner have problems with grieving, talk to your doctor. You can also try counseling. Give yourself time to grieve before trying to get pregnant again. °GET HELP RIGHT AWAY IF: °· You have bad cramps or pain in your back or belly (abdomen). °· You have a fever. °· You pass large clumps of blood (clots) from your vagina that are walnut-sized or larger. Save the clumps for your doctor to see. °· You pass large amounts of tissue from your vagina. Save the tissue for your doctor to see. °· You have more bleeding. °· You have thick, bad-smelling fluid (discharge) coming from the vagina. °· You get lightheaded, weak, or you pass out (faint). °· You have chills. °MAKE SURE YOU: °· Understand these instructions. °· Will watch your condition. °· Will get help right away if you are not doing well or get worse. °Document Released: 11/08/2011 Document Reviewed: 11/08/2011 °ExitCare® Patient Information ©2015 [employer]. This information is not intended to replace advice given to you by your health care provider. Make sure you discuss any questions you have with your health care provider. ° °

## 2014-12-13 NOTE — Telephone Encounter (Signed)
error 

## 2014-12-18 ENCOUNTER — Other Ambulatory Visit: Payer: Medicaid Other

## 2014-12-26 ENCOUNTER — Encounter: Payer: Medicaid Other | Admitting: Family Medicine

## 2015-01-01 ENCOUNTER — Encounter: Payer: Self-pay | Admitting: Obstetrics

## 2015-02-27 ENCOUNTER — Emergency Department (HOSPITAL_COMMUNITY)
Admission: EM | Admit: 2015-02-27 | Discharge: 2015-02-27 | Disposition: A | Discharge status: Home / Self Care | Payer: Medicaid Other | Attending: Emergency Medicine | Admitting: Emergency Medicine

## 2015-02-27 ENCOUNTER — Encounter (HOSPITAL_COMMUNITY): Payer: Self-pay | Admitting: Emergency Medicine

## 2015-02-27 ENCOUNTER — Emergency Department (HOSPITAL_COMMUNITY): Payer: Medicaid Other

## 2015-02-27 DIAGNOSIS — R102 Pelvic and perineal pain: Secondary | ICD-10-CM

## 2015-02-27 DIAGNOSIS — Z862 Personal history of diseases of the blood and blood-forming organs and certain disorders involving the immune mechanism: Secondary | ICD-10-CM | POA: Diagnosis not present

## 2015-02-27 DIAGNOSIS — R1030 Lower abdominal pain, unspecified: Secondary | ICD-10-CM | POA: Diagnosis present

## 2015-02-27 DIAGNOSIS — Z720 Tobacco use: Secondary | ICD-10-CM | POA: Insufficient documentation

## 2015-02-27 DIAGNOSIS — Z8742 Personal history of other diseases of the female genital tract: Secondary | ICD-10-CM | POA: Diagnosis not present

## 2015-02-27 DIAGNOSIS — Z9049 Acquired absence of other specified parts of digestive tract: Secondary | ICD-10-CM | POA: Insufficient documentation

## 2015-02-27 DIAGNOSIS — R110 Nausea: Secondary | ICD-10-CM | POA: Diagnosis not present

## 2015-02-27 DIAGNOSIS — Z880 Allergy status to penicillin: Secondary | ICD-10-CM | POA: Insufficient documentation

## 2015-02-27 LAB — CBC WITH DIFFERENTIAL/PLATELET
Basophils Absolute: 0 10*3/uL (ref 0.0–0.1)
Basophils Relative: 0 % (ref 0–1)
EOS PCT: 1 % (ref 0–5)
Eosinophils Absolute: 0.2 10*3/uL (ref 0.0–0.7)
HCT: 44 % (ref 36.0–46.0)
Hemoglobin: 14.6 g/dL (ref 12.0–15.0)
Lymphocytes Relative: 26 % (ref 12–46)
Lymphs Abs: 4 10*3/uL (ref 0.7–4.0)
MCH: 30.9 pg (ref 26.0–34.0)
MCHC: 33.2 g/dL (ref 30.0–36.0)
MCV: 93 fL (ref 78.0–100.0)
MONO ABS: 1.1 10*3/uL — AB (ref 0.1–1.0)
Monocytes Relative: 8 % (ref 3–12)
NEUTROS PCT: 65 % (ref 43–77)
Neutro Abs: 9.8 10*3/uL — ABNORMAL HIGH (ref 1.7–7.7)
Platelets: 381 10*3/uL (ref 150–400)
RBC: 4.73 MIL/uL (ref 3.87–5.11)
RDW: 12.7 % (ref 11.5–15.5)
WBC: 15.1 10*3/uL — ABNORMAL HIGH (ref 4.0–10.5)

## 2015-02-27 LAB — COMPREHENSIVE METABOLIC PANEL
ALBUMIN: 4.4 g/dL (ref 3.5–5.0)
ALT: 13 U/L — ABNORMAL LOW (ref 14–54)
AST: 12 U/L — AB (ref 15–41)
Alkaline Phosphatase: 81 U/L (ref 38–126)
Anion gap: 7 (ref 5–15)
BILIRUBIN TOTAL: 0.4 mg/dL (ref 0.3–1.2)
BUN: 17 mg/dL (ref 6–20)
CHLORIDE: 109 mmol/L (ref 101–111)
CO2: 24 mmol/L (ref 22–32)
CREATININE: 1.06 mg/dL — AB (ref 0.44–1.00)
Calcium: 9 mg/dL (ref 8.9–10.3)
GFR calc Af Amer: >60 mL/min (ref >60)
GFR calc non Af Amer: >60 mL/min (ref >60)
Glucose, Bld: 92 mg/dL (ref 65–99)
Potassium: 3.7 mmol/L (ref 3.5–5.1)
Sodium: 140 mmol/L (ref 135–145)
Total Protein: 7.6 g/dL (ref 6.5–8.1)

## 2015-02-27 LAB — WET PREP, GENITAL
CLUE CELLS WET PREP: NONE SEEN
Trich, Wet Prep: NONE SEEN
YEAST WET PREP: NONE SEEN

## 2015-02-27 LAB — URINALYSIS, ROUTINE W REFLEX MICROSCOPIC
Bilirubin Urine: NEGATIVE
Glucose, UA: NEGATIVE mg/dL
HGB URINE DIPSTICK: NEGATIVE
Ketones, ur: NEGATIVE mg/dL
LEUKOCYTES UA: NEGATIVE
NITRITE: NEGATIVE
Protein, ur: NEGATIVE mg/dL
Specific Gravity, Urine: 1.023 (ref 1.005–1.030)
Urobilinogen, UA: 0.2 mg/dL (ref 0.0–1.0)
pH: 6 (ref 5.0–8.0)

## 2015-02-27 LAB — HCG, QUANTITATIVE, PREGNANCY: hCG, Beta Chain, Quant, S: <1 m[IU]/mL (ref <5)

## 2015-02-27 LAB — LIPASE, BLOOD: LIPASE: 22 U/L (ref 22–51)

## 2015-02-27 MED ORDER — KETOROLAC TROMETHAMINE 60 MG/2ML IM SOLN
60.0000 mg | Freq: Once | INTRAMUSCULAR | Status: AC
  Administered 2015-02-27: 60 mg via INTRAMUSCULAR
  Filled 2015-02-27: qty 2

## 2015-02-27 MED ORDER — MORPHINE SULFATE 4 MG/ML IJ SOLN
4.0000 mg | Freq: Once | INTRAMUSCULAR | Status: AC
  Administered 2015-02-27: 4 mg via INTRAMUSCULAR
  Filled 2015-02-27: qty 1

## 2015-02-27 MED ORDER — ONDANSETRON HCL 4 MG PO TABS: 4.0000 mg | ORAL_TABLET | Freq: Four times a day (QID) | ORAL | Status: AC

## 2015-02-27 MED ORDER — ONDANSETRON HCL 4 MG PO TABS
4.0000 mg | ORAL_TABLET | Freq: Once | ORAL | Status: AC
Start: 2015-02-27 — End: 2015-02-27
  Administered 2015-02-27: 4 mg via ORAL
  Filled 2015-02-27: qty 1

## 2015-02-27 MED ORDER — HYDROCODONE-ACETAMINOPHEN 5-325 MG PO TABS
1.0000 | ORAL_TABLET | Freq: Once | ORAL | Status: AC
  Administered 2015-02-27: 1 via ORAL
  Filled 2015-02-27: qty 1

## 2015-02-27 MED ORDER — HYDROCODONE-ACETAMINOPHEN 5-325 MG PO TABS: 1.0000 | ORAL_TABLET | Freq: Four times a day (QID) | ORAL | Status: AC | PRN

## 2015-02-27 NOTE — ED Notes (Signed)
Returned from U/S

## 2015-02-27 NOTE — ED Notes (Signed)
RN AT BEDSIDE GETTING BLOOD

## 2015-02-27 NOTE — ED Provider Notes (Signed)
Patient seen/examined in the Emergency Department in conjunction with Midlevel Provider  Patient reports abd pain Exam : awake/alert, no focal abd tenderness Plan: stable for d/c home.  I don't suspect acute appendicitis.  We discussed strict ER return precautions Pt agreeable    Karla Rhineonald Romulus Hanrahan, MD 02/27/15 2129

## 2015-02-27 NOTE — ED Notes (Signed)
Patient transported to Ultrasound 

## 2015-02-27 NOTE — Discharge Instructions (Signed)
Return to the emergency room with worsening of symptoms, new symptoms or with symptoms that are concerning , especially fevers, abdominal pain in one area, unable to keep down fluids, blood in stool or vomit, severe pain, you feel faint, lightheaded or pass out. Please call your doctor for a followup appointment within 24-48 hours. When you talk to your doctor please let them know that you were seen in the emergency department and have them acquire all of your records so that they can discuss the findings with you and formulate a treatment plan to fully care for your new and ongoing problems.  Ibuprofen 400mg  (2 tablets 200mg ) every 5-6 hours for 3-5 days. Norco for severe pain. Do not operate machinery, drive or drink alcohol while taking narcotics or muscle relaxers. Read below information and follow recommendations.  Abdominal Pain, Women Abdominal (stomach, pelvic, or belly) pain can be caused by many things. It is important to tell your doctor:  The location of the pain.  Does it come and go or is it present all the time?  Are there things that start the pain (eating certain foods, exercise)?  Are there other symptoms associated with the pain (fever, nausea, vomiting, diarrhea)? All of this is helpful to know when trying to find the cause of the pain. CAUSES   Stomach: virus or bacteria infection, or ulcer.  Intestine: appendicitis (inflamed appendix), regional ileitis (Crohn's disease), ulcerative colitis (inflamed colon), irritable bowel syndrome, diverticulitis (inflamed diverticulum of the colon), or cancer of the stomach or intestine.  Gallbladder disease or stones in the gallbladder.  Kidney disease, kidney stones, or infection.  Pancreas infection or cancer.  Fibromyalgia (pain disorder).  Diseases of the female organs:  Uterus: fibroid (non-cancerous) tumors or infection.  Fallopian tubes: infection or tubal pregnancy.  Ovary: cysts or tumors.  Pelvic adhesions  (scar tissue).  Endometriosis (uterus lining tissue growing in the pelvis and on the pelvic organs).  Pelvic congestion syndrome (female organs filling up with blood just before the menstrual period).  Pain with the menstrual period.  Pain with ovulation (producing an egg).  Pain with an IUD (intrauterine device, birth control) in the uterus.  Cancer of the female organs.  Functional pain (pain not caused by a disease, may improve without treatment).  Psychological pain.  Depression. DIAGNOSIS  Your doctor will decide the seriousness of your pain by doing an examination.  Blood tests.  X-rays.  Ultrasound.  CT scan (computed tomography, special type of X-ray).  MRI (magnetic resonance imaging).  Cultures, for infection.  Barium enema (dye inserted in the large intestine, to better view it with X-rays).  Colonoscopy (looking in intestine with a lighted tube).  Laparoscopy (minor surgery, looking in abdomen with a lighted tube).  Major abdominal exploratory surgery (looking in abdomen with a large incision). TREATMENT  The treatment will depend on the cause of the pain.   Many cases can be observed and treated at home.  Over-the-counter medicines recommended by your caregiver.  Prescription medicine.  Antibiotics, for infection.  Birth control pills, for painful periods or for ovulation pain.  Hormone treatment, for endometriosis.  Nerve blocking injections.  Physical therapy.  Antidepressants.  Counseling with a psychologist or psychiatrist.  Minor or major surgery. HOME CARE INSTRUCTIONS   Do not take laxatives, unless directed by your caregiver.  Take over-the-counter pain medicine only if ordered by your caregiver. Do not take aspirin because it can cause an upset stomach or bleeding.  Try a clear liquid diet (  broth or water) as ordered by your caregiver. Slowly move to a bland diet, as tolerated, if the pain is related to the stomach or  intestine.  Have a thermometer and take your temperature several times a day, and record it.  Bed rest and sleep, if it helps the pain.  Avoid sexual intercourse, if it causes pain.  Avoid stressful situations.  Keep your follow-up appointments and tests, as your caregiver orders.  If the pain does not go away with medicine or surgery, you may try:  Acupuncture.  Relaxation exercises (yoga, meditation).  Group therapy.  Counseling. SEEK MEDICAL CARE IF:   You notice certain foods cause stomach pain.  Your home care treatment is not helping your pain.  You need stronger pain medicine.  You want your IUD removed.  You feel faint or lightheaded.  You develop nausea and vomiting.  You develop a rash.  You are having side effects or an allergy to your medicine. SEEK IMMEDIATE MEDICAL CARE IF:   Your pain does not go away or gets worse.  You have a fever.  Your pain is felt only in portions of the abdomen. The right side could possibly be appendicitis. The left lower portion of the abdomen could be colitis or diverticulitis.  You are passing blood in your stools (bright red or black tarry stools, with or without vomiting).  You have blood in your urine.  You develop chills, with or without a fever.  You pass out. MAKE SURE YOU:   Understand these instructions.  Will watch your condition.  Will get help right away if you are not doing well or get worse. Document Released: 06/13/2007 Document Revised: 12/31/2013 Document Reviewed: 07/03/2009 [facility] Patient Information 2015 [place], [place]. This information is not intended to replace advice given to you by your health care provider. Make sure you discuss any questions you have with your health care provider.

## 2015-02-27 NOTE — ED Notes (Signed)
MD at bedside. 

## 2015-02-27 NOTE — ED Provider Notes (Signed)
CSN: [id number]     Arrival date & time 02/27/15  1722 History   First MD Initiated Contact with Patient 02/27/15 1807     Chief Complaint  Patient presents with  . Abdominal Pain     (Consider location/radiation/quality/duration/timing/severity/associated sxs/prior Treatment) HPI  Karla Stafford is a 24 y.o. female with PMH of ovarian cysts presenting with suprapubic abdominal tenderness has been intermittent for the past 3 hours described as sharp and cramping with associated nausea without vomiting, diarrhea, fever, chills. Patient states she's not taken anything for her symptoms she denies alleviating factors or aggravating factors. She states similar to prior ovarian cyst pain with new cramping pain. Patient has had recent miscarriage in April and this had a normal menses with her last menses on the 13th for 2 days that resolved and she had bleeding yesterday but denies any vaginal bleeding today. She denies vaginal discharge or foul odor. She denies urinary symptoms. Last BM yesterday and normal without blood or melanotic stool.   Past Medical History  Diagnosis Date  . Ovarian cyst   . Anemia    Past Surgical History  Procedure Laterality Date  . Cholecystectomy     Family History  Problem Relation Age of Onset  . Diabetes Maternal Grandmother   . Hypertension Maternal Grandmother   . Autoimmune disease Maternal Grandmother   . Autoimmune disease     History  Substance Use Topics  . Smoking status: Light Tobacco Smoker    Types: Cigarettes  . Smokeless tobacco: Not on file  . Alcohol Use: No   OB History    Gravida Para Term Preterm AB TAB SAB Ectopic Multiple Living   Review of Systems 10 Systems reviewed and are negative for acute change except as noted in the HPI.    Allergies  Clindamycin/lincomycin; Doxycycline; and Penicillins  Home Medications   Prior to Admission medications   Medication Sig Start Date End Date Taking? Authorizing  Provider  HYDROcodone-acetaminophen (NORCO/VICODIN) 5-325 MG per tablet Take 1 tablet by mouth every 6 (six) hours as needed. 02/27/15   Oswaldo Conroy, PA-C  ibuprofen (ADVIL,MOTRIN) 800 MG tablet Take 1 tablet (800 mg total) by mouth every 8 (eight) hours as needed. Patient not taking: Reported on 02/27/2015 12/08/14   Archie Patten, CNM  ondansetron (ZOFRAN) 4 MG tablet Take 1 tablet (4 mg total) by mouth every 6 (six) hours. 02/27/15   Oswaldo Conroy, PA-C  oxyCODONE-acetaminophen (PERCOCET/ROXICET) 5-325 MG per tablet Take 1-2 tablets by mouth every 4 (four) hours as needed for severe pain. Patient not taking: Reported on 02/27/2015 12/11/14   Duane Lope, NP  promethazine (PHENERGAN) 25 MG tablet Take 1 tablet (25 mg total) by mouth every 6 (six) hours as needed. Patient not taking: Reported on 02/27/2015 12/01/14   Dorathy Kinsman, CNM   BP 125/84 mmHg  Pulse 67  Temp(Src) 98.4 F (36.9 C) (Oral)  Resp 15  SpO2 100%  LMP 10/16/2014  Breastfeeding? Unknown Physical Exam  Constitutional: She appears well-developed and well-nourished. No distress.  HENT:  Head: Normocephalic and atraumatic.  Eyes: Conjunctivae and EOM are normal. Right eye exhibits no discharge. Left eye exhibits no discharge.  Cardiovascular: Normal rate and regular rhythm.   Pulmonary/Chest: Effort normal and breath sounds normal. No respiratory distress. She has no wheezes.  Abdominal: Soft. Bowel sounds are normal. She exhibits no distension. There is no tenderness.  Genitourinary:  External genitalia without erythema, tenderness, lesions. Cervix pink without lesions. Os closed. No CMT. Moderate R adnexal tenderness. No adnexal masses appreciated. Minimal discharge without foul odor. Nursing tech in room for exam.  Neurological: She is alert. She exhibits normal muscle tone. Coordination normal.  Skin: Skin is warm and dry. She is not diaphoretic.  Nursing note and vitals reviewed.   ED Course  Procedures  (including critical care time) Labs Review Labs Reviewed  WET PREP, GENITAL - Abnormal; Notable for the following:    WBC, Wet Prep HPF POC FEW (*)    All other components within normal limits  COMPREHENSIVE METABOLIC PANEL - Abnormal; Notable for the following:    Creatinine, Ser 1.06 (*)    AST 12 (*)    ALT 13 (*)    All other components within normal limits  CBC WITH DIFFERENTIAL/PLATELET - Abnormal; Notable for the following:    WBC 15.1 (*)    Neutro Abs 9.8 (*)    Monocytes Absolute 1.1 (*)    All other components within normal limits  LIPASE, BLOOD  HCG, QUANTITATIVE, PREGNANCY  URINALYSIS, ROUTINE W REFLEX MICROSCOPIC (NOT AT [facility])  RPR  HIV ANTIBODY (ROUTINE TESTING)  GC/CHLAMYDIA PROBE AMP ([place]) NOT AT [facility]    Imaging Review US Transvaginal Non-ob  02/27/2015   CLINICAL DATA:  Right lower quadrant pain.  EXAM: TRANSABDOMINAL AND TRANSVAGINAL ULTRASOUND OF PELVIS  DOPPLER ULTRASOUND OF OVARIES  TECHNIQUE: Both transabdominal and transvaginal ultrasound examinations of the pelvis were performed. Transabdominal technique was performed for global imaging of the pelvis including uterus, ovaries, adnexal regions, and pelvic cul-de-sac.  It was necessary to proceed with endovaginal exam following the transabdominal exam to visualize the ovaries. Color and duplex Doppler ultrasound was utilized to evaluate blood flow to the ovaries.  COMPARISON:  None.  FINDINGS: Uterus  Measurements: 8.7 x 3.8 x 5.6 cm. No fibroids or other mass visualized.  Endometrium  Thickness: 5 mm.  No focal abnormality visualized.  Right ovary  Measurements: 2 x 1.6 x 1.5 cm. Normal appearance/no adnexal mass.  Left ovary  Measurements: 3 x 1.9 x 2.0 cm. Normal appearance/no adnexal mass.  Pulsed Doppler evaluation of both ovaries demonstrates normal low-resistance arterial and venous waveforms.  Other findings  No free fluid.  IMPRESSION: 1. Normal pelvic sonogram. 2. No explanation for patient's right  lower quadrant pain and no evidence for ovarian torsion.   Electronically Signed   By: Signa Kell M.D.   On: 02/27/2015 20:33   US Pelvis Complete  02/27/2015   CLINICAL DATA:  Right lower quadrant pain.  EXAM: TRANSABDOMINAL AND TRANSVAGINAL ULTRASOUND OF PELVIS  DOPPLER ULTRASOUND OF OVARIES  TECHNIQUE: Both transabdominal and transvaginal ultrasound examinations of the pelvis were performed. Transabdominal technique was performed for global imaging of the pelvis including uterus, ovaries, adnexal regions, and pelvic cul-de-sac.  It was necessary to proceed with endovaginal exam following the transabdominal exam to visualize the ovaries. Color and duplex Doppler ultrasound was utilized to evaluate blood flow to the ovaries.  COMPARISON:  None.  FINDINGS: Uterus  Measurements: 8.7 x 3.8 x 5.6 cm. No fibroids or other mass visualized.  Endometrium  Thickness: 5 mm.  No focal abnormality visualized.  Right ovary  Measurements: 2 x 1.6 x 1.5 cm. Normal appearance/no adnexal mass.  Left ovary  Measurements: 3 x 1.9 x 2.0 cm. Normal appearance/no adnexal mass.  Pulsed Doppler evaluation of both ovaries demonstrates normal low-resistance arterial and venous waveforms.  Other findings  No free fluid.  IMPRESSION: 1. Normal pelvic sonogram. 2. No explanation for patient's right lower quadrant pain and no evidence for ovarian torsion.   Electronically Signed   By: Signa Kellaylor  Stroud M.D.   On: 02/27/2015 20:33   [place] Art/ven Flow Abd Pelv Doppler  02/27/2015   CLINICAL DATA:  Right lower quadrant pain.  EXAM: TRANSABDOMINAL AND TRANSVAGINAL ULTRASOUND OF PELVIS  DOPPLER ULTRASOUND OF OVARIES  TECHNIQUE: Both transabdominal and transvaginal ultrasound examinations of the pelvis were performed. Transabdominal technique was performed for global imaging of the pelvis including uterus, ovaries, adnexal regions, and pelvic cul-de-sac.  It was necessary to proceed with endovaginal exam following the transabdominal exam to  visualize the ovaries. Color and duplex Doppler ultrasound was utilized to evaluate blood flow to the ovaries.  COMPARISON:  None.  FINDINGS: Uterus  Measurements: 8.7 x 3.8 x 5.6 cm. No fibroids or other mass visualized.  Endometrium  Thickness: 5 mm.  No focal abnormality visualized.  Right ovary  Measurements: 2 x 1.6 x 1.5 cm. Normal appearance/no adnexal mass.  Left ovary  Measurements: 3 x 1.9 x 2.0 cm. Normal appearance/no adnexal mass.  Pulsed Doppler evaluation of both ovaries demonstrates normal low-resistance arterial and venous waveforms.  Other findings  No free fluid.  IMPRESSION: 1. Normal pelvic sonogram. 2. No explanation for patient's right lower quadrant pain and no evidence for ovarian torsion.   Electronically Signed   By: Signa Kellaylor  Stroud M.D.   On: 02/27/2015 20:33     EKG Interpretation None      MDM   Final diagnoses:  Right adnexal tenderness  Nausea   Patient presenting with right adnexal tenderness and she states she gets every year once or twice. She endorses nausea but no other abdominal symptoms. VSS. No abdominal tenderness on exam. Patient with right adnexal tenderness. Lab work reassuring. Patient does have leukocytosis percent change from her prior and does not clinically correlate. Patient does have elevated creatinine which was discussed with patient just had rechecked by her primary care provider. Patient not pregnant. No evidence of infection her urine. Few white blood cells on wet prep. No CMT. I doubt PID. Pelvic ultrasound without evidence of adnexal masses, ovarian torsion, acute abnormalities. Pt reports improvement in her pain. Script for norco. Driving and sedation precautions provided. Pt well appearing, nontoxic and stable for discharge. I doubt acute abdominal pelvic process. I doubt appendicitis in patient afebrile, no abdominal tenderness and history of prior similar pain with multiple negative CT.   Discussed return precautions with patient. Discussed  all results and patient verbalizes understanding and agrees with plan.  This is a shared patient. This patient was discussed with the physician who saw and evaluated the patient and agrees with the plan.   Oswaldo ConroyVictoria Kohlton Gilpatrick, PA-C 02/27/15 2137  Zadie Rhineonald Wickline, MD 02/27/15 [phone]

## 2015-02-27 NOTE — ED Notes (Addendum)
Pt c/o abdominal pain starting 2.5 hours ago. Has had nausea but denies vomiting. Has eaten today. Denies hx DM. No other c/c. Had a menstrual cycle 6/13 -abnormal for her only lasting 2 days. Started bleeding yesterday with abdominal pain radiating to right groin. Says this is abnormal for her. Unsure if she could be pregnant. Had a miscarriage in April. Hx ovarian cyst.

## 2015-02-28 LAB — GC/CHLAMYDIA PROBE AMP (~~LOC~~) NOT AT ARMC
Chlamydia: NEGATIVE
Neisseria Gonorrhea: NEGATIVE

## 2015-03-01 LAB — HIV ANTIBODY (ROUTINE TESTING W REFLEX): HIV Screen 4th Generation wRfx: NONREACTIVE

## 2015-03-01 LAB — RPR: RPR Ser Ql: NONREACTIVE

## 2015-03-04 ENCOUNTER — Other Ambulatory Visit: Payer: Self-pay | Admitting: Certified Nurse Midwife

## 2015-12-14 IMAGING — US US OB TRANSVAGINAL
1 series · 14 of 28 positions shown · non-contrast
Comparison: None.

CLINICAL DATA: Cramping and bleeding

EXAM:
OBSTETRIC <14 WK US AND TRANSVAGINAL OB US
TECHNIQUE: Both transabdominal and transvaginal ultrasound examinations were
performed for complete evaluation of the gestation as well as the
maternal uterus, adnexal regions, and pelvic cul-de-sac.
Transvaginal technique was performed to assess early pregnancy.

[Series 1: us ob comp less 14 wks · 14 of 41 slices shown]
[im 2/41]
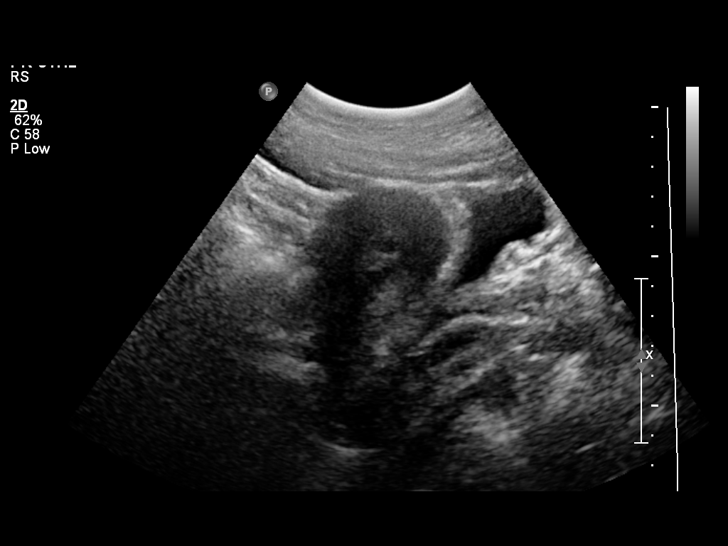
[im 5/41]
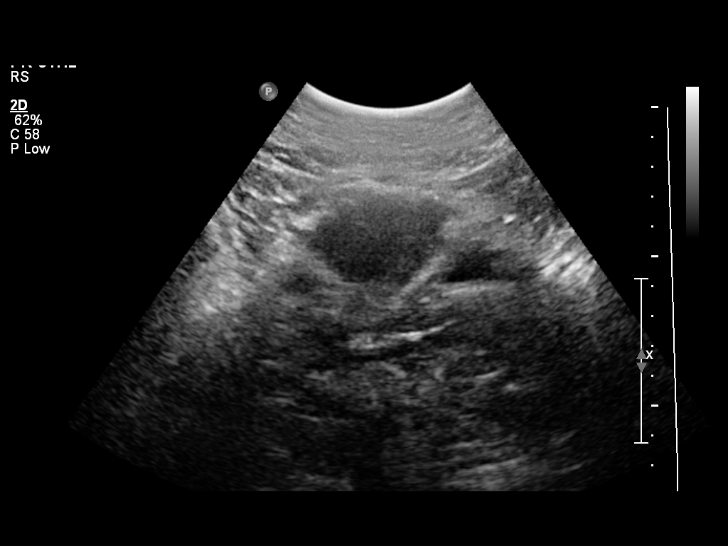
[im 8/41]
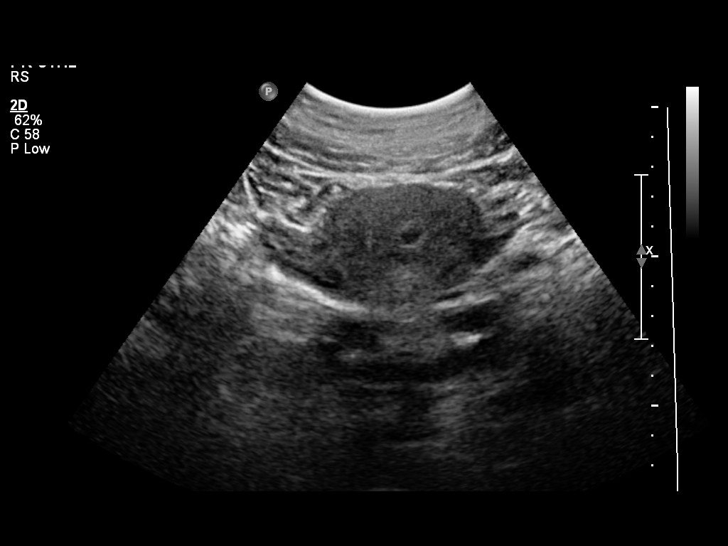
[im 11/41]
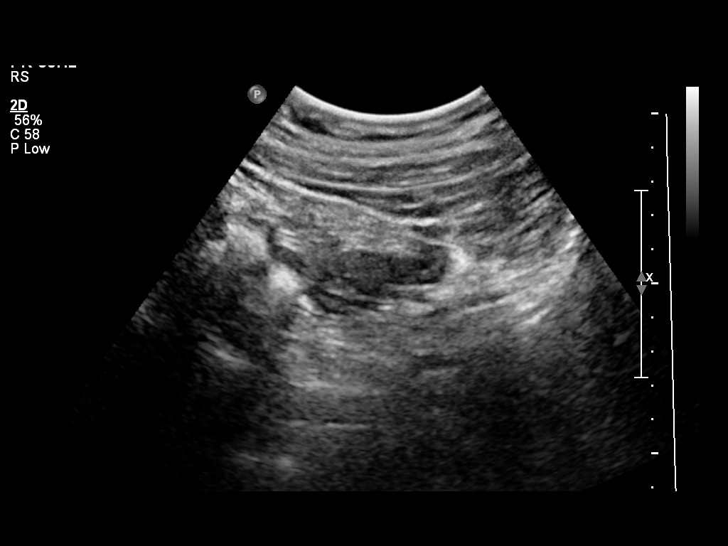
[im 14/41]
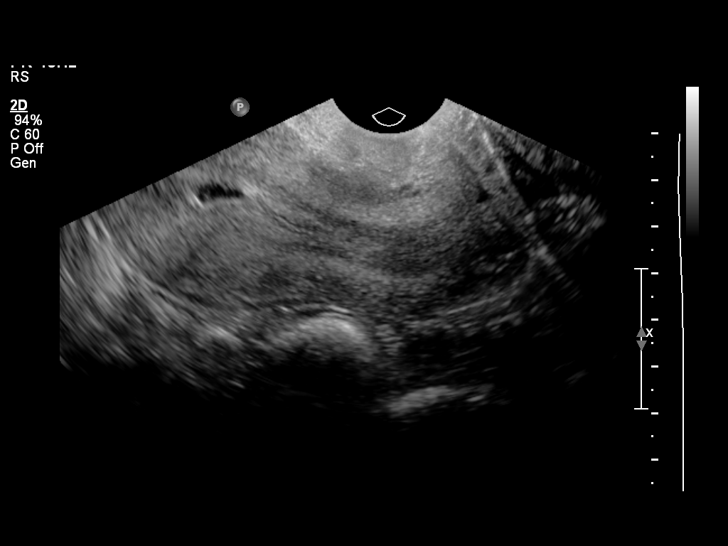
[im 17/41]
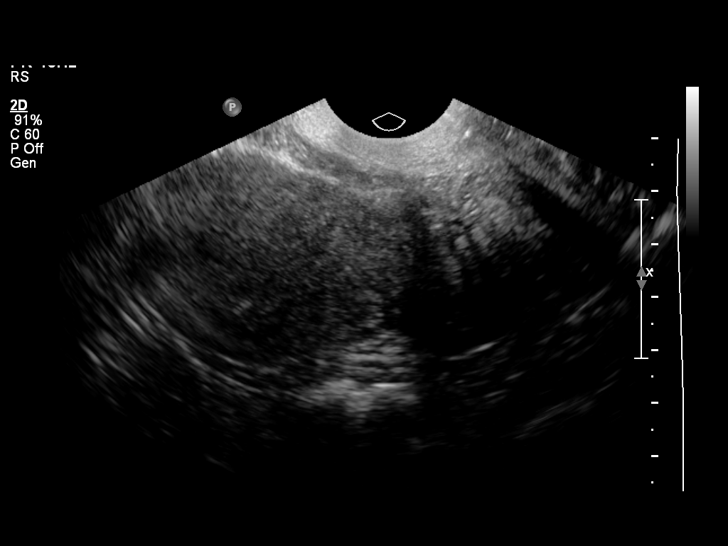
[im 20/41]
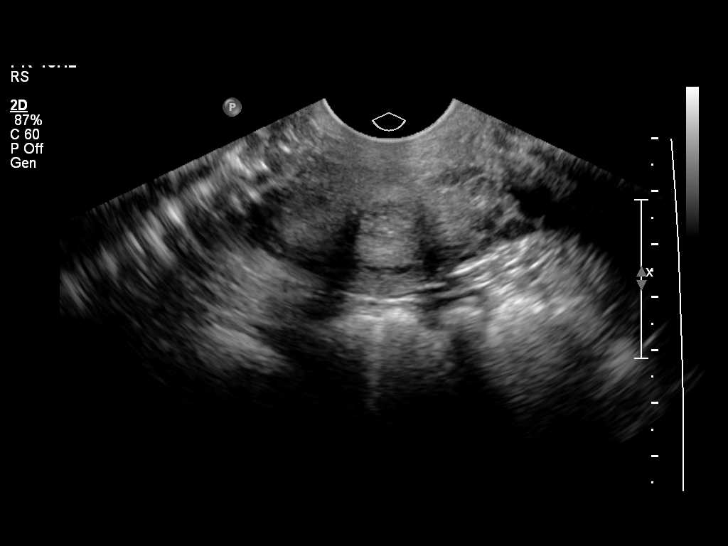
[im 23/41]
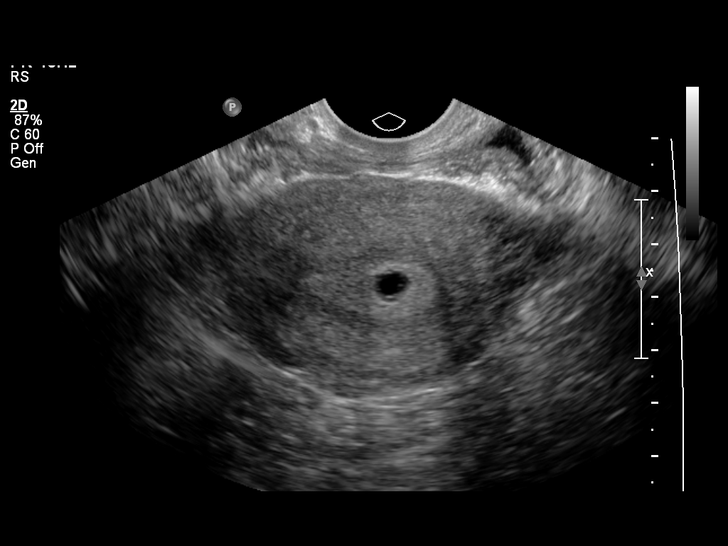
[im 26/41]
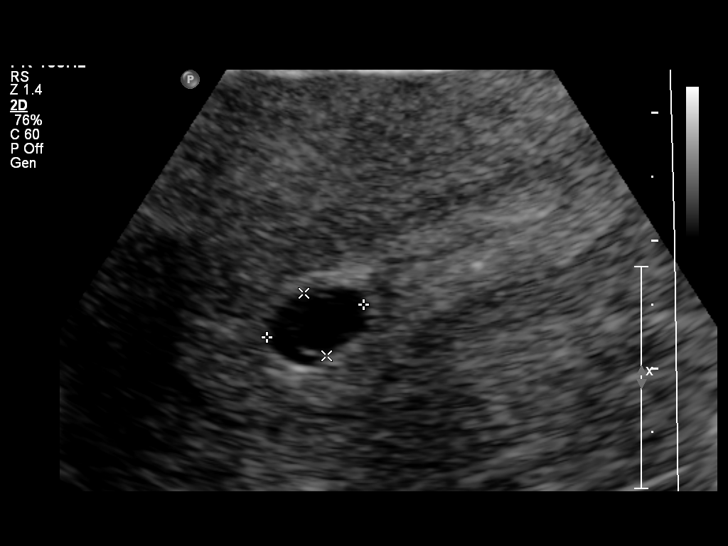
[im 29/41]
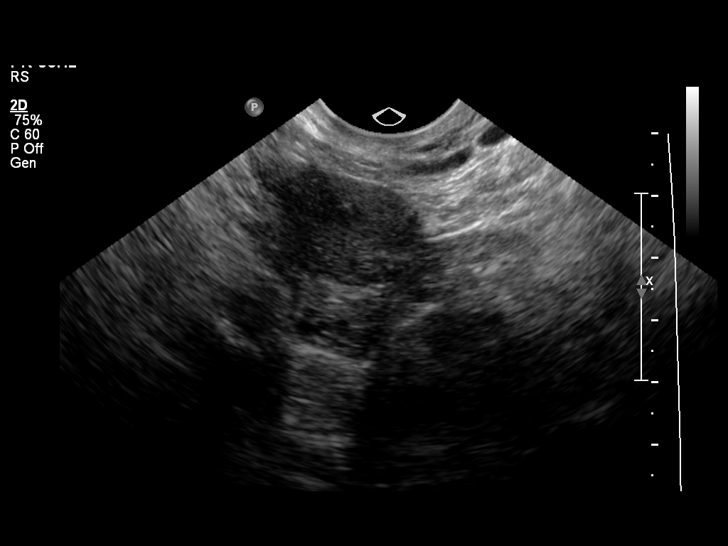
[im 32/41]
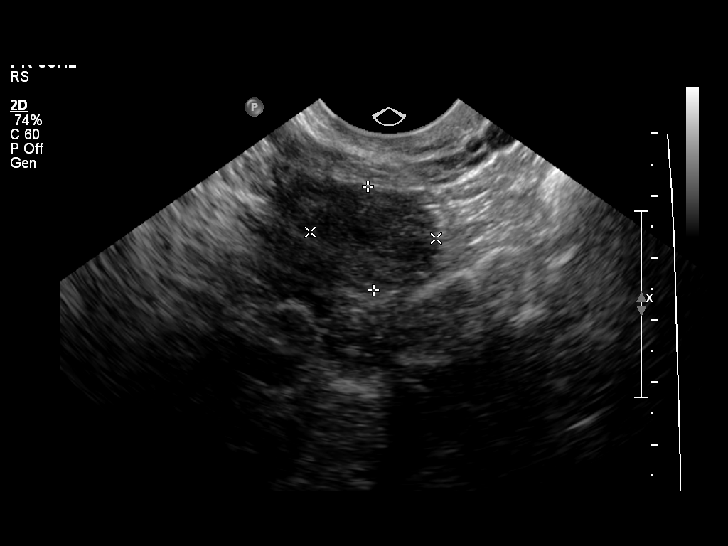
[im 35/41]
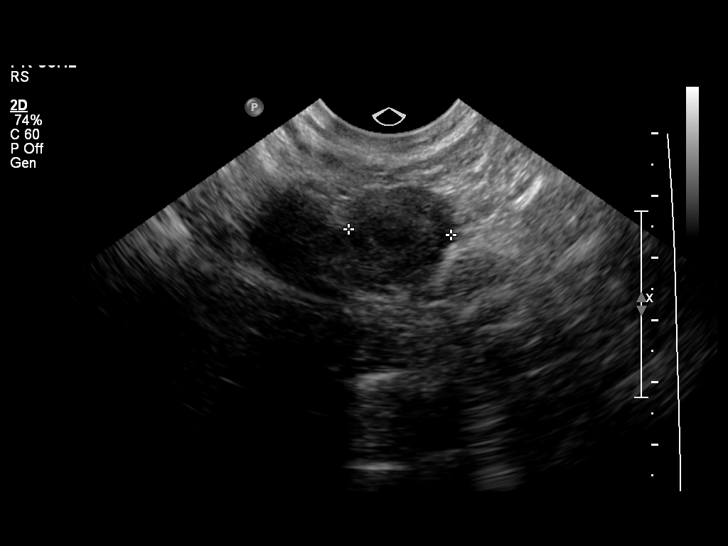
[im 38/41]
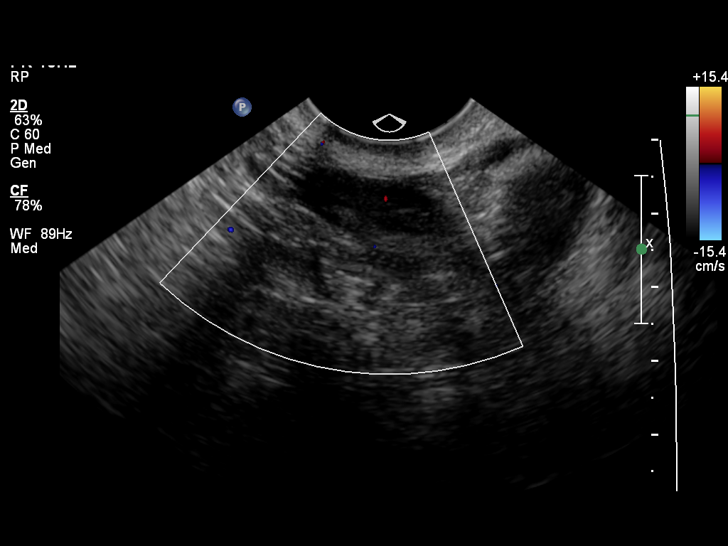
[im 41/41]
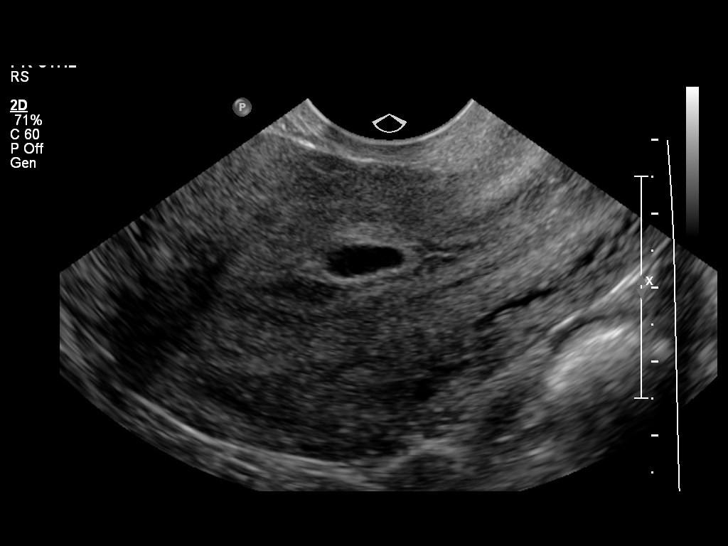

[14 of 28 positions shown; findings below may reference images not displayed]

FINDINGS: Intrauterine gestational sac: Visible

Yolk sac:  Visible

Embryo:  No

Cardiac Activity: No

Heart Rate: No  bpm

MSD: 6.5  Mm   5 w   to  d

Maternal uterus/adnexae: Right ovarian corpus luteal cyst measuring
less than 2 cm
IMPRESSION: Intrauterine gestational sac, too small to characterize. No fetal
pole, or cardiac activity yet visualized. Recommend follow-up
quantitative B-HCG levels and follow-up US in 14 days to confirm and
assess viability. This recommendation follows SRU consensus
guidelines: Diagnostic Criteria for Nonviable Pregnancy Early in the
First Trimester. N Engl J Med 4390; [DATE].

## 2015-12-21 IMAGING — US US OB TRANSVAGINAL
1 series · 13 of 18 positions shown · non-contrast
Comparison: Reported during downtime. No prior comparison
available..

CLINICAL DATA: Heavy bleeding and pain. Follow-up viability.
Quantitative beta HCG is pending.

EXAM:
TRANSVAGINAL OB ULTRASOUND
TECHNIQUE: Transvaginal ultrasound was performed for complete evaluation of the
gestation as well as the maternal uterus, adnexal regions, and
pelvic cul-de-sac.

[Series 1: us ob transvaginal · 13 of 18 slices shown]
[im 1/18]
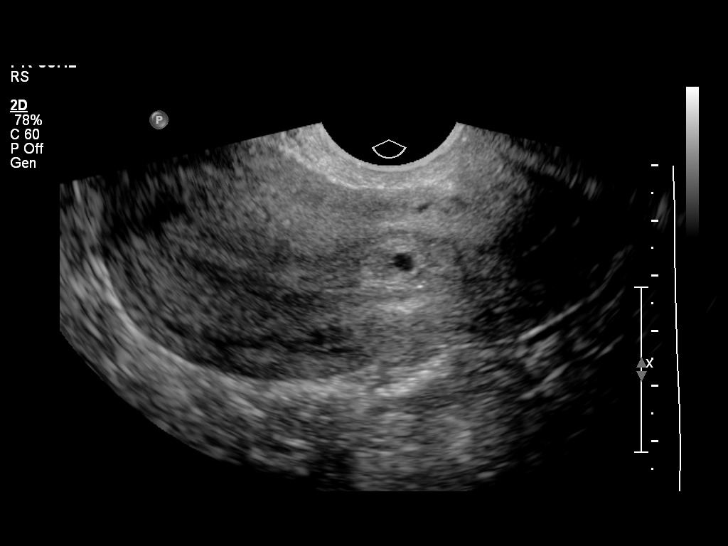
[im 3/18]
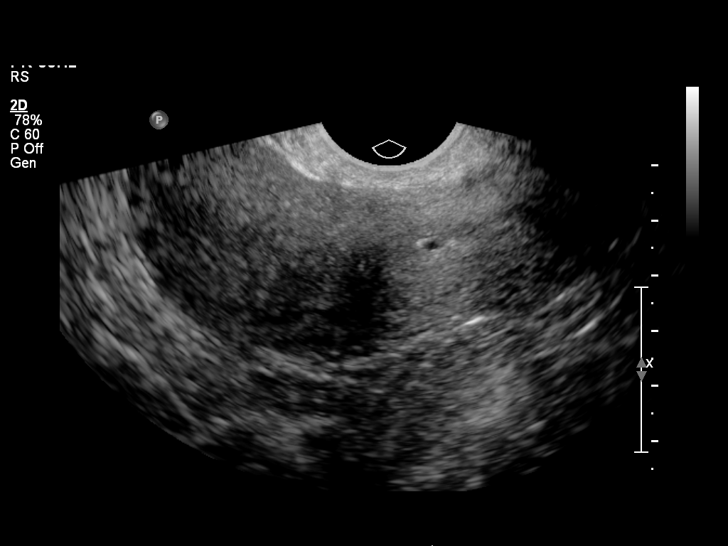
[im 4/18]
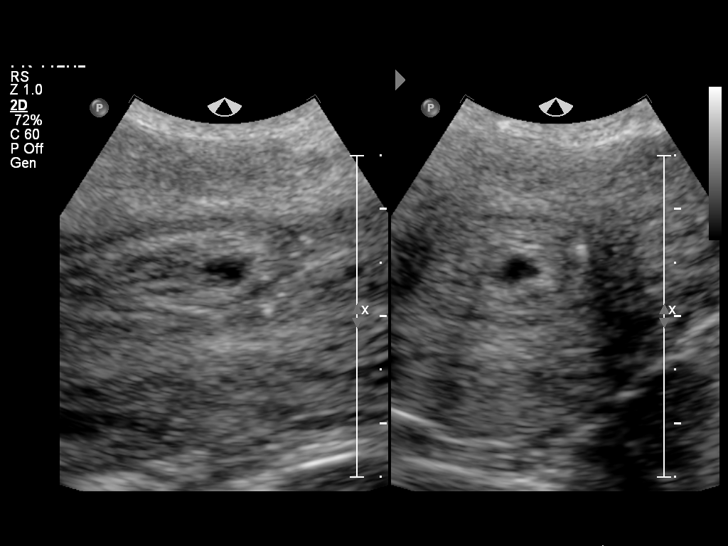
[im 5/18]
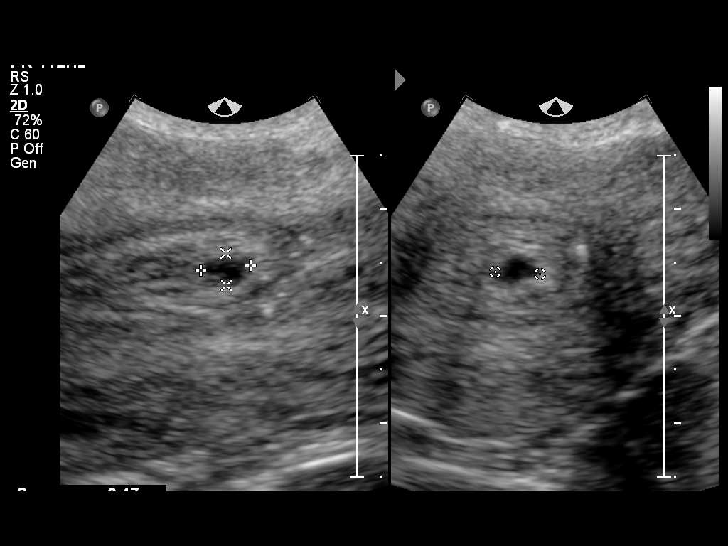
[im 7/18]
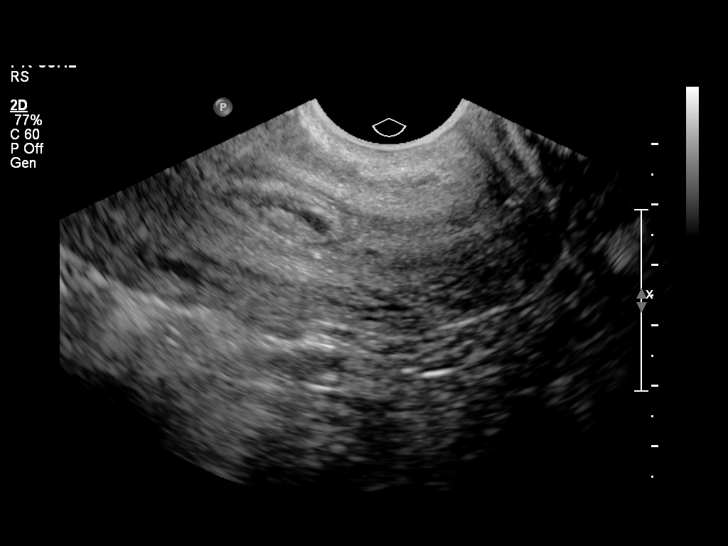
[im 8/18]
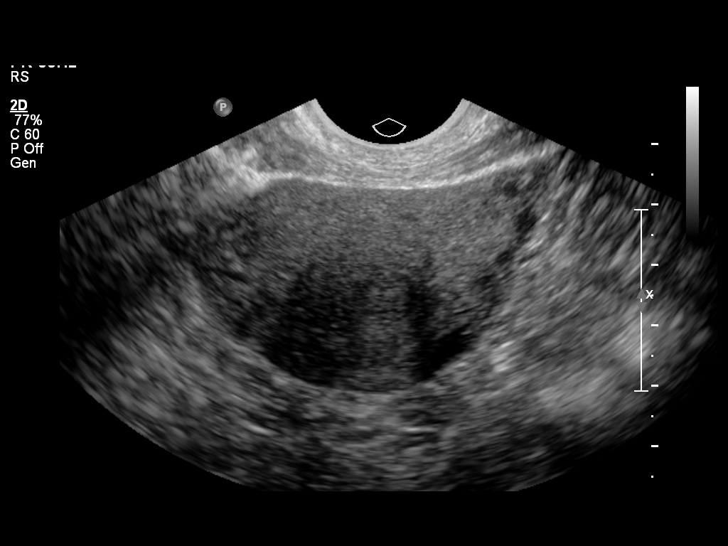
[im 10/18]
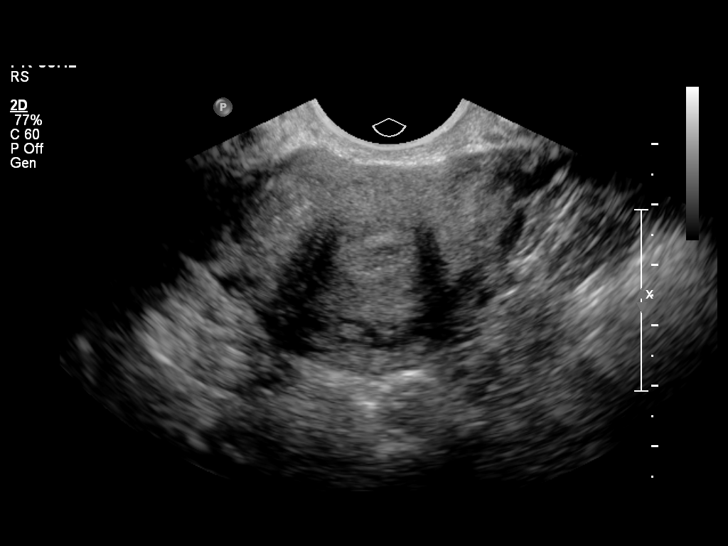
[im 11/18]
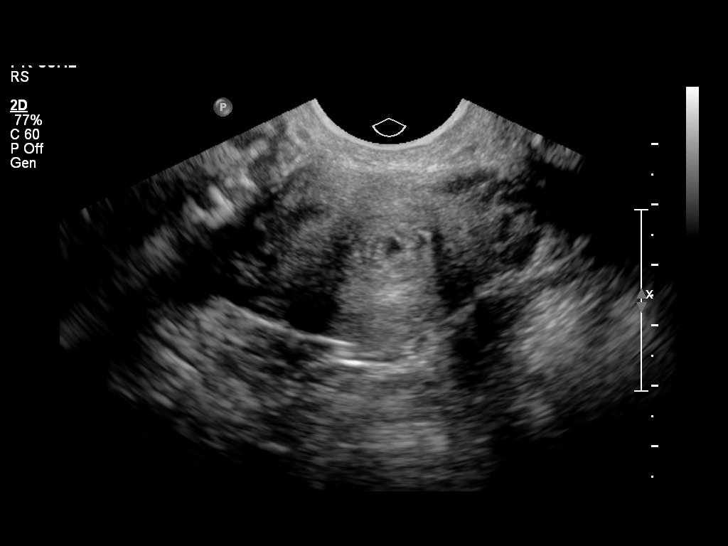
[im 12/18]
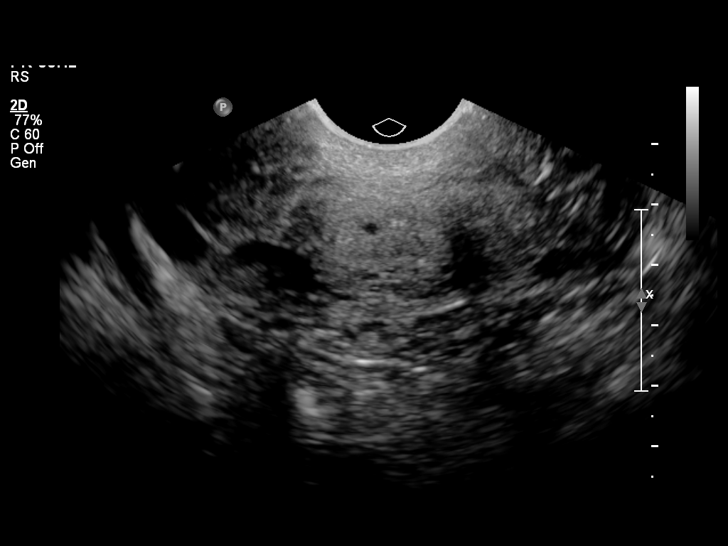
[im 14/18]
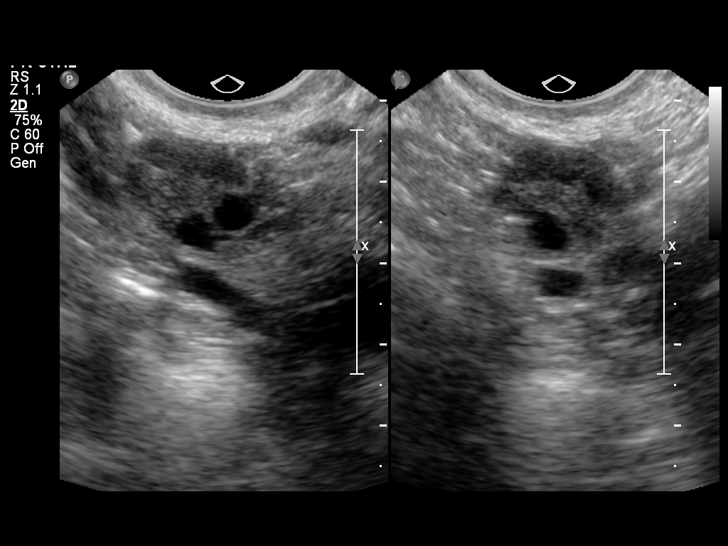
[im 15/18]
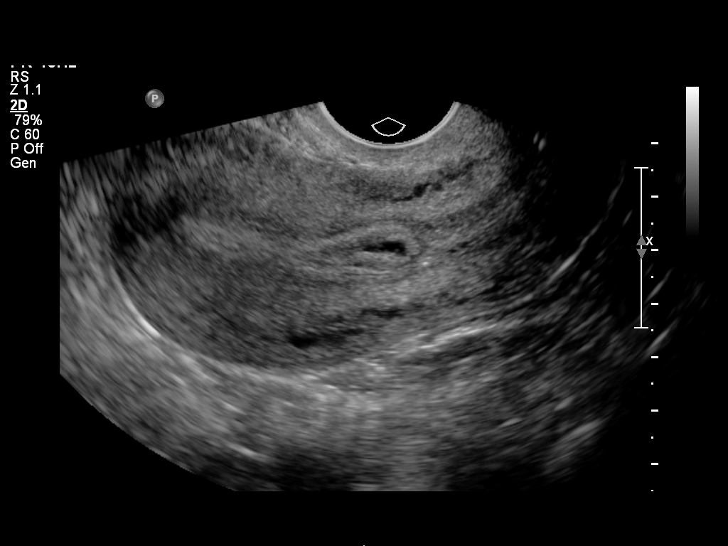
[im 16/18]
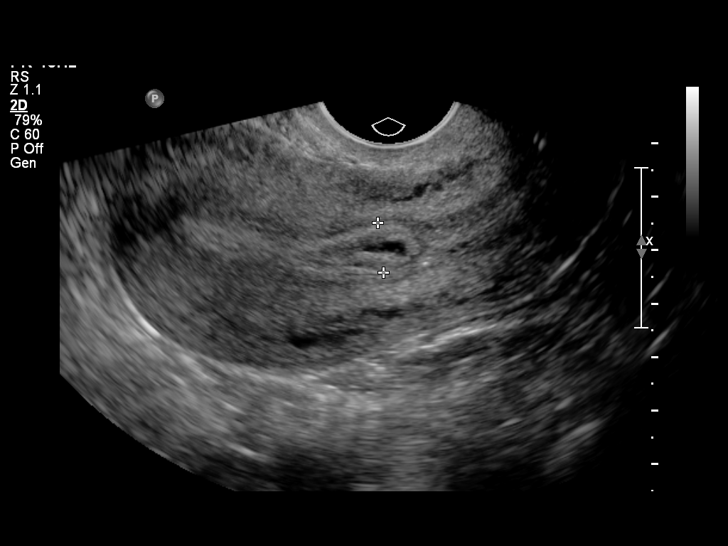
[im 18/18]
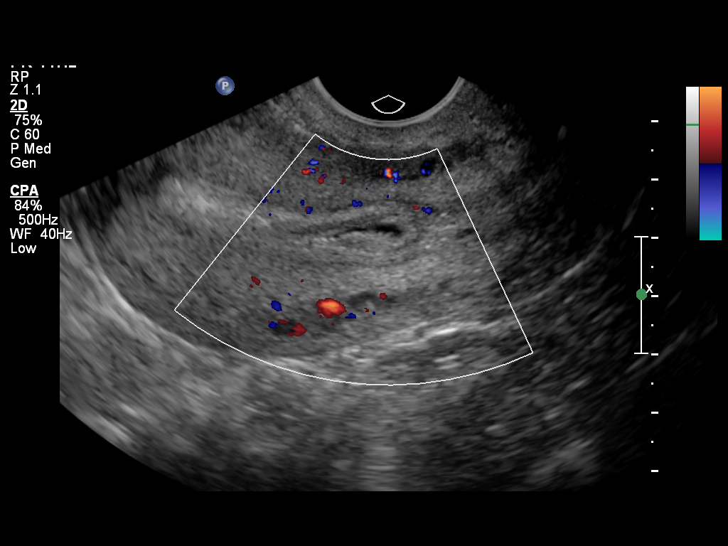

[13 of 18 positions shown; findings below may reference images not displayed]

FINDINGS: Intrauterine gestational sac: A single intrauterine gestational sac
is visualized in the lower uterine segment. Sac appears to be
collapsed.

Yolk sac:  Not identified.

Embryo:  Not identified.

Cardiac Activity: Not identified.

MSD: 4  mm   4 w   6  d                 US EDC: 08/11/2015

Maternal uterus/adnexae: No subchorionic hemorrhage demonstrated.
Both ovaries are visualized and appear normal. No free pelvic fluid
collections.
IMPRESSION: A single intrauterine gestational sac is visualized in the lower
uterine segment appears collapsed. Fetal pole and yolk sac are not
identified. Prior studies are not available for comparison, but
report of previous study from 12/01/2014 indicates decreasing size
of gestational sac. This is suspicious for intrauterine fetal demise
or blighted ovum.

This exam was interpreted during a PACS downtime with limited
availability of comparison cases. It has been flagged for review
following the downtime. If clinically indicated after this review,
an addendum will be issued providing details about comparison to
prior imaging.

## 2016-11-22 IMAGING — US US ART/VEN ABD/PELV/SCROTUM DOPPLER LTD
1 series · 13 of 25 positions shown · non-contrast
Comparison: None.

CLINICAL DATA: Right lower quadrant pain.

EXAM:
TRANSABDOMINAL AND TRANSVAGINAL ULTRASOUND OF PELVIS
DOPPLER ULTRASOUND OF OVARIES
TECHNIQUE: Both transabdominal and transvaginal ultrasound examinations of the
pelvis were performed. Transabdominal technique was performed for
global imaging of the pelvis including uterus, ovaries, adnexal
regions, and pelvic cul-de-sac.
It was necessary to proceed with endovaginal exam following the
transabdominal exam to visualize the ovaries.. Color and duplex
Doppler ultrasound was utilized to evaluate blood flow to the
ovaries.

[Series 1: us art/ven abd/pelv/scrotum doppler ltd · 0.24mm/px · 13 of 65 slices shown]
[im 1/65]
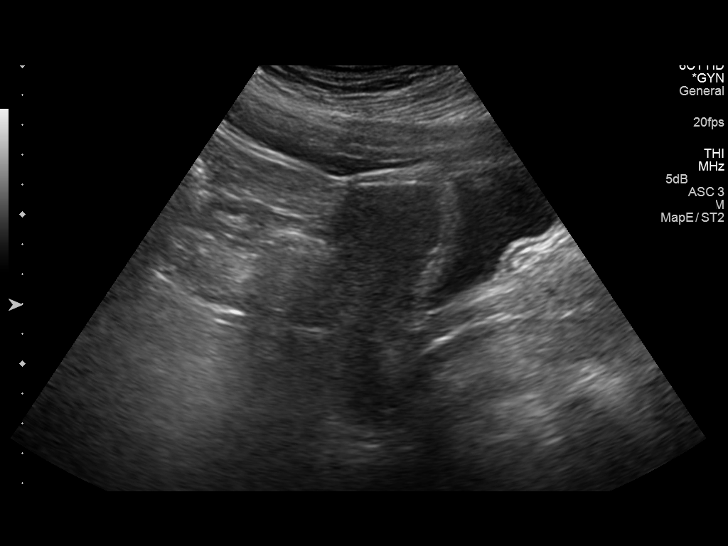
[im 6/65]
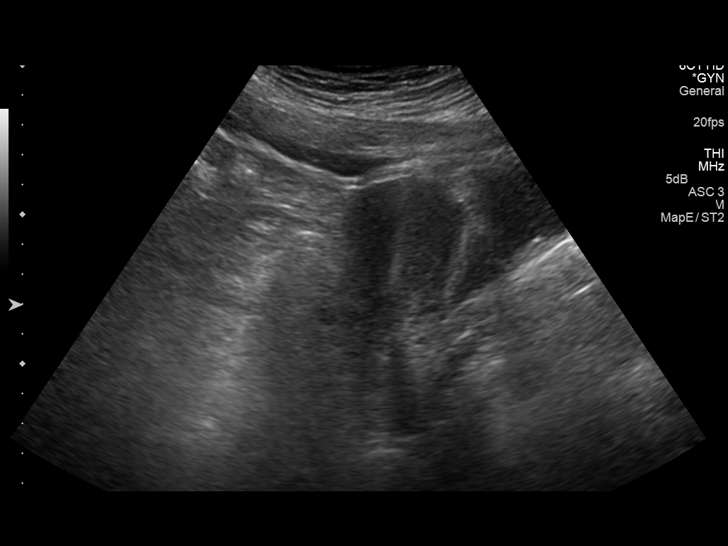
[im 11/65]
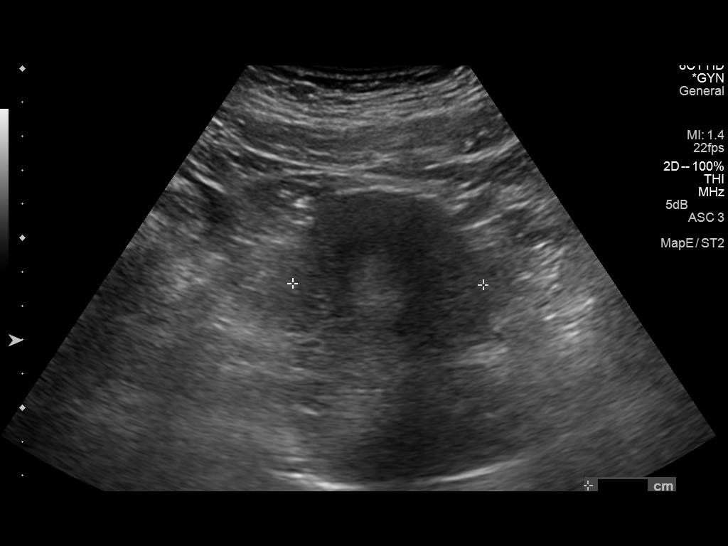
[im 17/65]
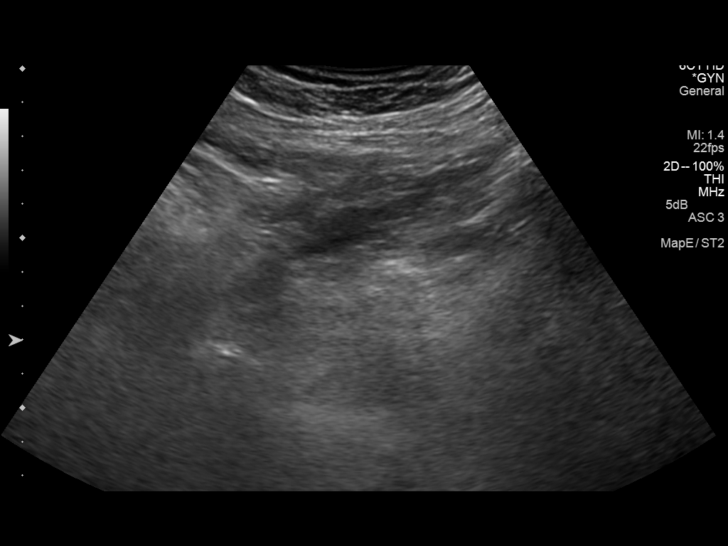
[im 22/65]
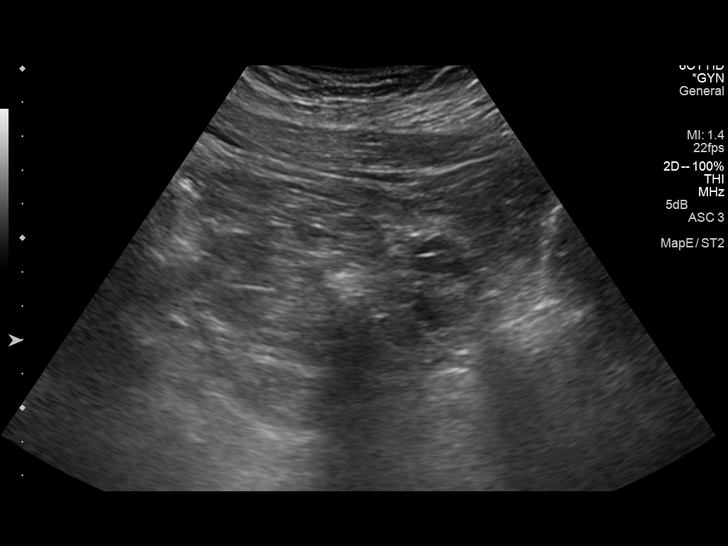
[im 27/65]
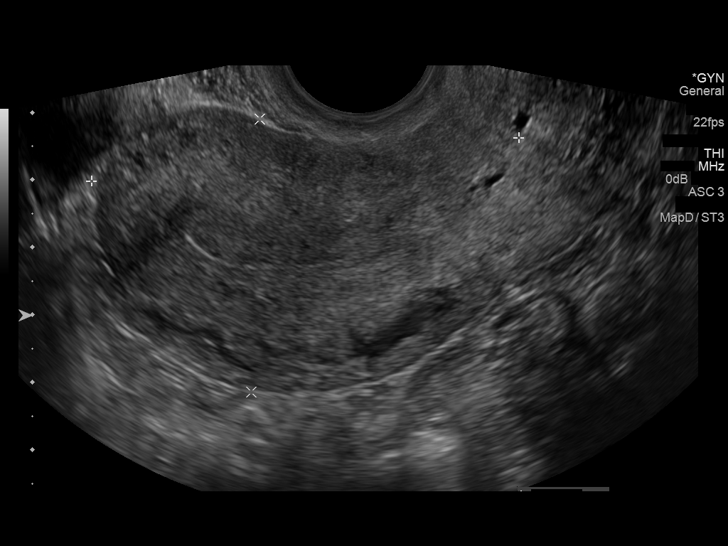
[im 33/65]
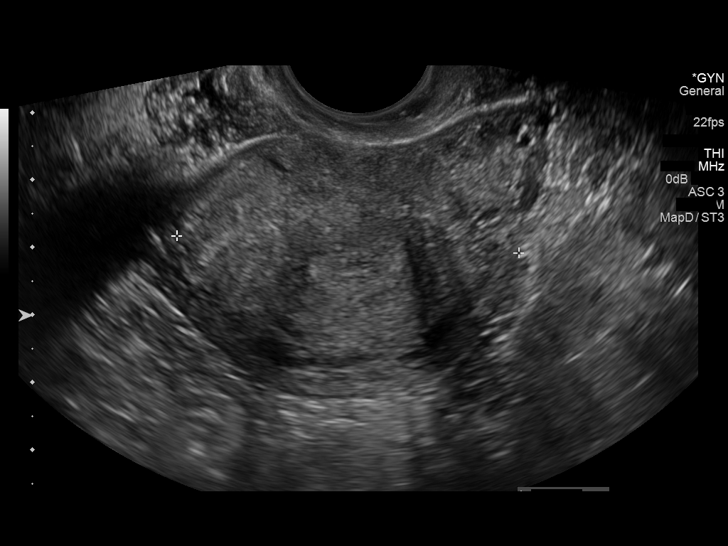
[im 38/65]
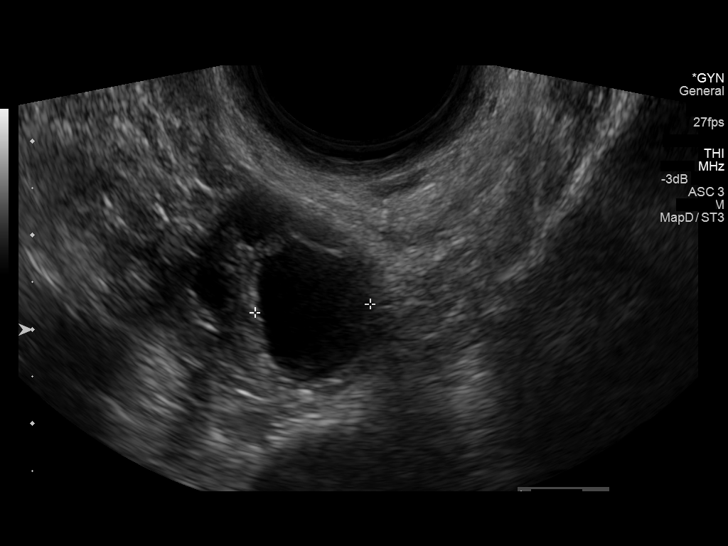
[im 43/65]
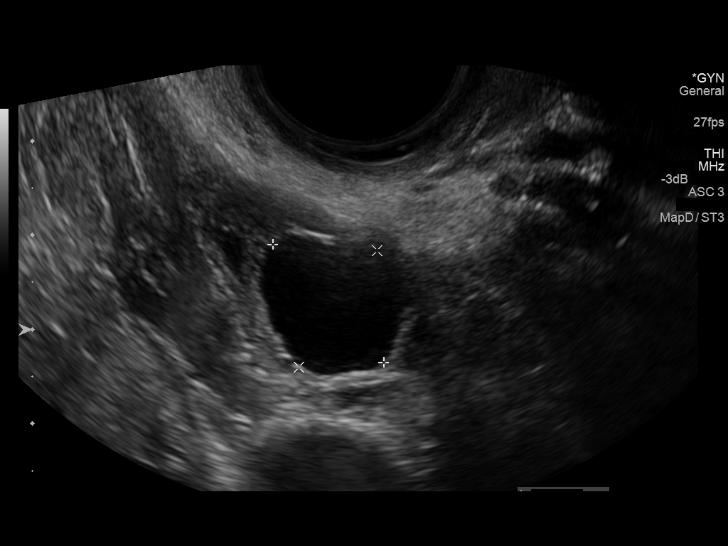
[im 49/65]
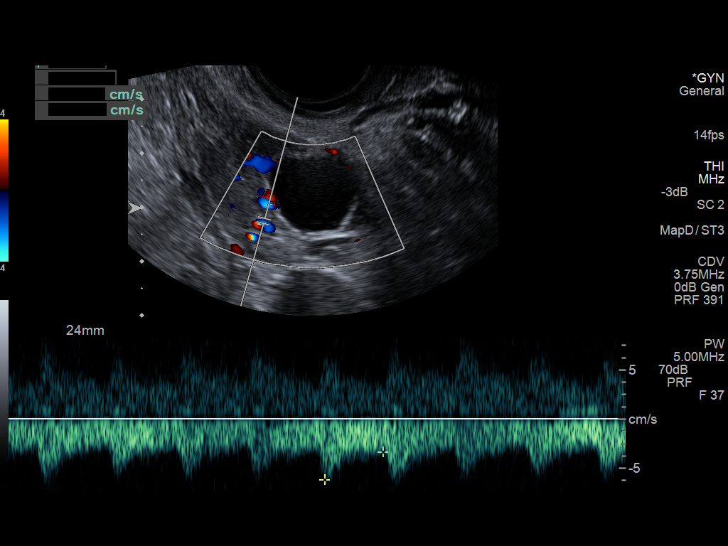
[im 54/65]
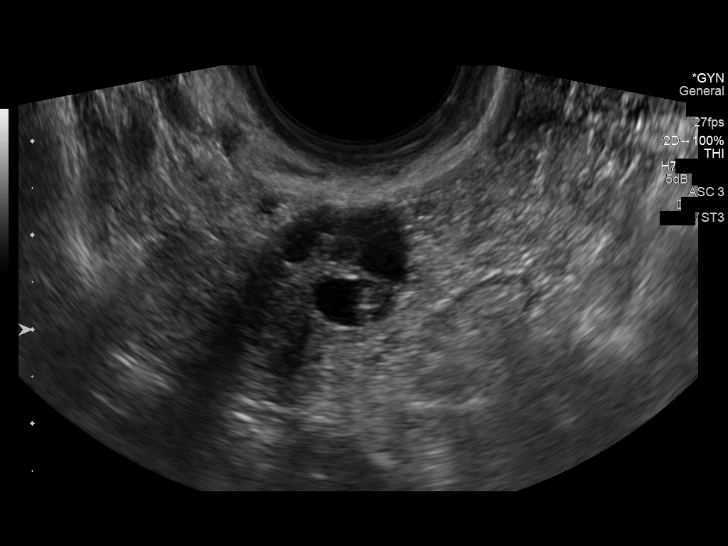
[im 59/65]
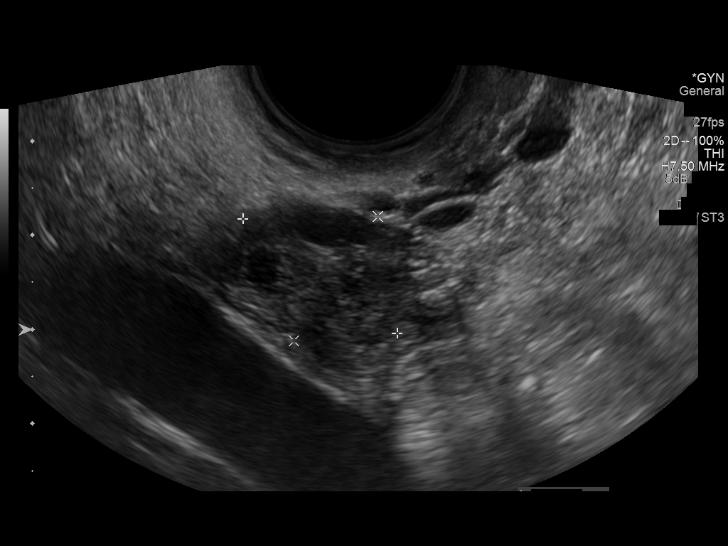
[im 65/65]
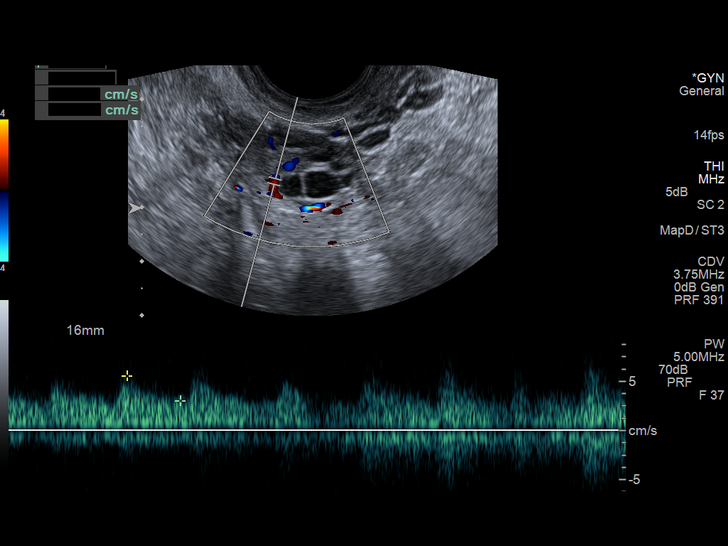

[13 of 25 positions shown; findings below may reference images not displayed]

FINDINGS: Uterus

Measurements: 8.7 x 3.8 x 5.6 cm. No fibroids or other mass
visualized.

Endometrium

Thickness: 5 mm..  No focal abnormality visualized.

Right ovary

Measurements: 2 x 1.6 x 1.5 cm. Normal appearance/no adnexal mass.

Left ovary

Measurements: 3 x 1.9 x 2.0 cm. Normal appearance/no adnexal mass.

Pulsed Doppler evaluation of both ovaries demonstrates normal
low-resistance arterial and venous waveforms.

Other findings

No free fluid.
IMPRESSION: 1. Normal pelvic sonogram.
2. No explanation for patient's right lower quadrant pain and no
evidence for ovarian torsion.
